# Patient Record
Sex: Male | Born: 1980
Health system: Southern US, Community
[De-identification: ages and names within clinical notes are randomized; demographics above are authoritative.]

## PROBLEM LIST (undated history)

## (undated) DIAGNOSIS — Z8669 Personal history of other diseases of the nervous system and sense organs: Secondary | ICD-10-CM

## (undated) DIAGNOSIS — R0989 Other specified symptoms and signs involving the circulatory and respiratory systems: Secondary | ICD-10-CM

## (undated) DIAGNOSIS — E782 Mixed hyperlipidemia: Secondary | ICD-10-CM

## (undated) DIAGNOSIS — K589 Irritable bowel syndrome without diarrhea: Secondary | ICD-10-CM

## (undated) HISTORY — PX: OTHER SURGICAL HISTORY: SHX169

## (undated) HISTORY — DX: Other specified symptoms and signs involving the circulatory and respiratory systems: R09.89

## (undated) HISTORY — DX: Mixed hyperlipidemia: E78.2

## (undated) HISTORY — DX: Irritable bowel syndrome, unspecified: K58.9

## (undated) HISTORY — DX: Personal history of other diseases of the nervous system and sense organs: Z86.69

## (undated) HISTORY — PX: WISDOM TOOTH EXTRACTION: SHX21

---

## 2011-01-15 ENCOUNTER — Telehealth: Payer: Self-pay | Admitting: Gastroenterology

## 2011-01-15 NOTE — Telephone Encounter (Signed)
Dwayne Pittman at Dr Kathryne Sharper office about 2:50pm. When I checked with Mike Gip, PA and looked for an appt it was 3:05pm before I called back and got the answering service-office closed at 3pm.  Information that crossed over in Epic is not current  Because we only took the md office contact.  Lurena Joiner reports pt with hx of IBS that Dr Oneta Rack has been managing. Today and yesterday, pt reports blood in his stool- 1st time ever. He was seen in their office today and they requested he be seen today. Pt c/o abdominal discomfort and llq pain. He denies diarrhea, only the bleeding.  Scheduled pt to see Dr Arlyce Dice on Monday, April 9, at 10:0am .  Called the answering service, [phone] and got Dr Elisabeth Most who will try to contact the pt about the appt. I will call their office on Monday and request office notes.

## 2011-01-19 ENCOUNTER — Ambulatory Visit (INDEPENDENT_AMBULATORY_CARE_PROVIDER_SITE_OTHER): Payer: Commercial Managed Care - PPO | Admitting: Gastroenterology

## 2011-01-19 ENCOUNTER — Encounter: Payer: Self-pay | Admitting: Gastroenterology

## 2011-01-19 VITALS — BP 114/70 | HR 80 | Ht 69.0 in | Wt 198.6 lb

## 2011-01-19 DIAGNOSIS — K589 Irritable bowel syndrome without diarrhea: Secondary | ICD-10-CM | POA: Insufficient documentation

## 2011-01-19 DIAGNOSIS — K625 Hemorrhage of anus and rectum: Secondary | ICD-10-CM

## 2011-01-19 MED ORDER — PEG-KCL-NACL-NASULF-NA ASC-C 100 G PO SOLR: 1.0000 | Freq: Once | ORAL | Status: AC

## 2011-01-19 MED ORDER — HYDROCORTISONE ACETATE 25 MG RE SUPP: 25.0000 mg | Freq: Two times a day (BID) | RECTAL | Status: AC

## 2011-01-19 NOTE — Telephone Encounter (Signed)
Received info from Dr Kathryne Sharper ofc and spoke with pt who will come in today.

## 2011-01-19 NOTE — Assessment & Plan Note (Signed)
Limited rectal bleeding is most likely due to hemorrhoids.  Recommendations #1 Anusol HC suppositories  #2 colonoscopy  Risks, alternatives, and complications of the procedure, including bleeding, perforation, and possible need for surgery, were explained to the patient.  Patient's questions were answered.

## 2011-01-19 NOTE — Patient Instructions (Addendum)
Hemorrhoids Hemorrhoids are dilated (enlarged) veins around the rectum. Sometimes clots will form in the veins. This makes them swollen and painful. These are called thrombosed hemorrhoids. Causes of hemorrhoids include:  Pregnancy: this increases the pressure in the hemorrhoidal veins.   Constipation.   Straining to have a bowel movement.  HOME CARE INSTRUCTIONS  Eat a well balanced diet and drink 6 to 8 glasses of water every day to avoid constipation. You may also use a bulk laxative.   Avoid straining to have bowel movements.   Keep anal area dry and clean.   Only take over-the-counter or prescription medicines for pain, discomfort, or fever as directed by your caregiver.  If thrombosed:  Take hot sitz baths for 20 to 30 minutes, 3 to 4 times per day.   If the hemorrhoids are very tender and swollen, place ice packs on area as tolerated. Using ice packs between sitz baths may be helpful. Fill a plastic bag with ice and use a towel between the bag of ice and your skin.   Special creams and suppositories (Anusol, Nupercainal, Wyanoids) may be used or applied as directed.   Do not use a donut shaped pillow or sit on the toilet for long periods. This increases blood pooling and pain.   Move your bowels when your body has the urge; this will require less straining and will decrease pain and pressure.   Only take over-the-counter or prescription medicines for pain, discomfort, or fever as directed by your caregiver.  SEEK MEDICAL CARE IF:  You have increasing pain and swelling that is not controlled with your prescription.   You have uncontrolled bleeding.   You have an inability or difficulty having a bowel movement.   You have pain or inflammation outside the area of the hemorrhoids.   You have chills and/or an oral temperature above 100 that lasts for 2 days or longer, or as your caregiver suggests.  MAKE SURE YOU:   Understand these instructions.   Will watch your  condition.   Will get help right away if you are not doing well or get worse.  Document Released: 09/25/2000 Document Re-Released: 09/10/2008 [facility] Patient Information 2011 [place], [place]. Your Colonoscopy is scheduled on 01/22/2011 at 9am We have sent your MoviPrep in to your pharmacy today   Colonoscopy A colonoscopy is an exam to evaluate your entire colon. In this exam, your colon is cleansed. A long fiberoptic tube is inserted through your rectum and into your colon. The fiberoptic scope (endoscope) is a long bundle of enclosed and very flexible fibers. These fibers transmit light to the area examined and send images from that area to your caregiver. Discomfort is usually minimal. You may be given a drug to help you sleep (sedative) during or prior to the procedure. This exam helps to detect lumps (tumors), polyps, inflammation, and areas of bleeding. Your caregiver may also take a small piece of tissue (biopsy) that will be examined under a microscope. BEFORE THE PROCEDURE  A clear liquid diet may be required for 2 days before the exam.   Liquid injections (enemas) or laxatives may be required.   A large amount of electrolyte solution may be given to you to drink over a short period of time. This solution is used to clean out your colon.   You should be present 1  prior to your procedure or as directed by your caregiver.   Check in at the admissions desk to fill out necessary forms if not  preregistered. There will be consent forms to sign prior to the procedure. If accompanied by friends or family, there is a waiting area for them while you are having your procedure.  LET YOUR CAREGIVER KNOW ABOUT:  Allergies to food or medicine.  Medicines taken, including vitamins, herbs, eyedrops, over-the-counter medicines, and creams.   Use of steroids (by mouth or creams).   Previous problems with anesthetics or numbing medicines.   History of bleeding problems or blood  clots.  Previous surgery.   Other health problems, including diabetes and kidney problems.   Possibility of pregnancy, if this applies.   AFTER THE PROCEDURE  If you received a sedative and/or pain medicine, you will need to arrange for someone to drive you home.   Occasionally, there is a little blood passed with the first bowel movement. DO NOT be concerned.  HOME CARE INSTRUCTIONS  It is not unusual to pass moderate amounts of gas and experience mild abdominal cramping following the procedure. This is due to air being used to inflate your colon during the exam. Walking or a warm pack on your belly (abdomen) may help.   You may resume all normal meals and activities after sedatives and medicines have worn off.   Only take over-the-counter or prescription medicines for pain, discomfort, or fever as directed by your caregiver. DO NOT use aspirin or blood thinners if a biopsy was taken. Consult your caregiver for medicine usage if biopsies were taken.  FINDING OUT THE RESULTS OF YOUR TEST Not all test results are available during your visit. If your test results are not back during the visit, make an appointment with your caregiver to find out the results. Do not assume everything is normal if you have not heard from your caregiver or the medical facility. It is important for you to follow up on all of your test results. SEEK IMMEDIATE MEDICAL CARE IF:  You pass large blood clots or fill a toilet with blood following the procedure. This may also occur 10 to 14 days following the procedure. This is more likely if a biopsy was taken.   You develop abdominal pain that keeps getting worse and cannot be relieved with medicine.  Document Released: 09/25/2000 Document Re-Released: 12/23/2009 [facility] Patient Information 2011 [place], [place].

## 2011-01-19 NOTE — Assessment & Plan Note (Signed)
The patient has symptoms compatible with mild IBS.  Recommendations #1 fiber supplementation. #2 continue hyomax

## 2011-01-19 NOTE — Progress Notes (Signed)
History of Present Illness:  [name] is a pleasant, 30 year old white male referred at the request of Dr. Oneta Rack for evaluation of rectal bleeding. For years he's had  Irritable bowel characterized by crampy lower bowel pain and loose stools, associated with stress and anxiety. He has a history of hemorrhoids characterized by rectal discomfort. In the last few weeks he has noted bright red blood mixed with his stools,  which are loose. He is stressed at the pregnancy of his girlfriend. He is currently without rectal pain.  Recent blood work in April, 2012 was pertinent for a normal CBC. Total bilirubin was 1.5 and indirect 1.3.    Review of Systems: Pertinent positive and negative review of systems were noted in the above HPI section. All other review of systems were otherwise negative.    Current Medications, Allergies, Past Medical History, Past Surgical History, Family History and Social History were reviewed in [facility] electronic medical record  Vital signs were reviewed in today's medical record. Physical Exam: General: Well developed , well nourished, no acute distress Head: Normocephalic and atraumatic Eyes:  sclerae anicteric, EOMI Ears: Normal auditory acuity Mouth: No deformity or lesions Lungs: Clear throughout to auscultation Heart: Regular rate and rhythm; no murmurs, rubs or bruits Abdomen: Soft, non tender and non distended. No masses, hepatosplenomegaly or hernias noted. Normal Bowel sounds Rectal:deferred Musculoskeletal: Symmetrical with no gross deformities  Pulses:  Normal pulses noted Extremities: No clubbing, cyanosis, edema or deformities noted Neurological: Alert oriented x 4, grossly nonfocal Psychological:  Alert and cooperative. Normal mood and affect

## 2011-01-19 NOTE — Assessment & Plan Note (Signed)
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

## 2011-01-20 ENCOUNTER — Encounter: Payer: Self-pay | Admitting: Gastroenterology

## 2011-01-21 ENCOUNTER — Encounter: Payer: Self-pay | Admitting: Gastroenterology

## 2011-01-22 ENCOUNTER — Encounter: Payer: Self-pay | Admitting: Gastroenterology

## 2011-01-22 ENCOUNTER — Ambulatory Visit (AMBULATORY_SURGERY_CENTER): Payer: Commercial Managed Care - PPO | Admitting: Gastroenterology

## 2011-01-22 VITALS — BP 123/72 | HR 73 | Temp 97.8°F | Resp 21 | Ht 69.0 in | Wt 195.0 lb

## 2011-01-22 DIAGNOSIS — K922 Gastrointestinal hemorrhage, unspecified: Secondary | ICD-10-CM

## 2011-01-22 DIAGNOSIS — K625 Hemorrhage of anus and rectum: Secondary | ICD-10-CM

## 2011-01-22 DIAGNOSIS — K648 Other hemorrhoids: Secondary | ICD-10-CM

## 2011-01-22 MED ORDER — SODIUM CHLORIDE 0.9 % IV SOLN: 500.0000 mL | INTRAVENOUS | Status: DC

## 2011-01-22 NOTE — Patient Instructions (Addendum)
Hemorrhoids Hemorrhoids are dilated (enlarged) veins around the rectum. Sometimes clots will form in the veins. This makes them swollen and painful. These are called thrombosed hemorrhoids. Causes of hemorrhoids include:  Pregnancy: this increases the pressure in the hemorrhoidal veins.   Constipation.   Straining to have a bowel movement.  HOME CARE INSTRUCTIONS  Eat a well balanced diet and drink 6 to 8 glasses of water every day to avoid constipation. You may also use a bulk laxative.   Avoid straining to have bowel movements.   Keep anal area dry and clean.   Only take over-the-counter or prescription medicines for pain, discomfort, or fever as directed by your caregiver.  If thrombosed:  Take hot sitz baths for 20 to 30 minutes, 3 to 4 times per day.   If the hemorrhoids are very tender and swollen, place ice packs on area as tolerated. Using ice packs between sitz baths may be helpful. Fill a plastic bag with ice and use a towel between the bag of ice and your skin.   Special creams and suppositories (Anusol, Nupercainal, Wyanoids) may be used or applied as directed.   Do not use a donut shaped pillow or sit on the toilet for long periods. This increases blood pooling and pain.   Move your bowels when your body has the urge; this will require less straining and will decrease pain and pressure.   Only take over-the-counter or prescription medicines for pain, discomfort, or fever as directed by your caregiver.  SEEK MEDICAL CARE IF:  You have increasing pain and swelling that is not controlled with your prescription.   You have uncontrolled bleeding.   You have an inability or difficulty having a bowel movement.   You have pain or inflammation outside the area of the hemorrhoids.   You have chills and/or an oral temperature above 100 that lasts for 2 days or longer, or as your caregiver suggests.  MAKE SURE YOU:   Understand these instructions.   Will watch your  condition.   Will get help right away if you are not doing well or get worse.  Document Released: 09/25/2000 Document Re-Released: 09/10/2008 [facility] Patient Information 2011 [place], [place]. Discharge instructions given with verbal understanding.

## 2011-01-23 ENCOUNTER — Telehealth: Payer: Self-pay | Admitting: *Deleted

## 2011-01-23 NOTE — Telephone Encounter (Signed)

## 2013-09-14 ENCOUNTER — Other Ambulatory Visit: Payer: Self-pay | Admitting: Internal Medicine

## 2014-03-10 DIAGNOSIS — Z8669 Personal history of other diseases of the nervous system and sense organs: Secondary | ICD-10-CM | POA: Insufficient documentation

## 2014-03-10 DIAGNOSIS — R0989 Other specified symptoms and signs involving the circulatory and respiratory systems: Secondary | ICD-10-CM | POA: Insufficient documentation

## 2014-03-10 DIAGNOSIS — E782 Mixed hyperlipidemia: Secondary | ICD-10-CM | POA: Insufficient documentation

## 2014-03-15 ENCOUNTER — Ambulatory Visit (INDEPENDENT_AMBULATORY_CARE_PROVIDER_SITE_OTHER): Payer: Commercial Managed Care - PPO | Admitting: Internal Medicine

## 2014-03-15 ENCOUNTER — Encounter: Payer: Self-pay | Admitting: Internal Medicine

## 2014-03-15 VITALS — BP 106/70 | HR 76 | Temp 98.1°F | Resp 16 | Ht 68.75 in | Wt 191.8 lb

## 2014-03-15 DIAGNOSIS — E559 Vitamin D deficiency, unspecified: Secondary | ICD-10-CM | POA: Insufficient documentation

## 2014-03-15 DIAGNOSIS — Z113 Encounter for screening for infections with a predominantly sexual mode of transmission: Secondary | ICD-10-CM

## 2014-03-15 DIAGNOSIS — Z1212 Encounter for screening for malignant neoplasm of rectum: Secondary | ICD-10-CM

## 2014-03-15 DIAGNOSIS — R740 Nonspecific elevation of levels of transaminase and lactic acid dehydrogenase [LDH]: Secondary | ICD-10-CM

## 2014-03-15 DIAGNOSIS — R0989 Other specified symptoms and signs involving the circulatory and respiratory systems: Secondary | ICD-10-CM

## 2014-03-15 DIAGNOSIS — Z0000 Encounter for general adult medical examination without abnormal findings: Secondary | ICD-10-CM

## 2014-03-15 DIAGNOSIS — R7402 Elevation of levels of lactic acid dehydrogenase (LDH): Secondary | ICD-10-CM

## 2014-03-15 DIAGNOSIS — R7401 Elevation of levels of liver transaminase levels: Secondary | ICD-10-CM

## 2014-03-15 DIAGNOSIS — Z111 Encounter for screening for respiratory tuberculosis: Secondary | ICD-10-CM

## 2014-03-15 LAB — CBC WITH DIFFERENTIAL/PLATELET
BASOS PCT: 0 % (ref 0–1)
Basophils Absolute: 0 10*3/uL (ref 0.0–0.1)
EOS PCT: 1 % (ref 0–5)
Eosinophils Absolute: 0.1 10*3/uL (ref 0.0–0.7)
HEMATOCRIT: 43.6 % (ref 39.0–52.0)
Hemoglobin: 15.2 g/dL (ref 13.0–17.0)
Lymphocytes Relative: 28 % (ref 12–46)
Lymphs Abs: 1.6 10*3/uL (ref 0.7–4.0)
MCH: 29.5 pg (ref 26.0–34.0)
MCHC: 34.9 g/dL (ref 30.0–36.0)
MCV: 84.5 fL (ref 78.0–100.0)
MONO ABS: 0.3 10*3/uL (ref 0.1–1.0)
Monocytes Relative: 6 % (ref 3–12)
Neutro Abs: 3.6 10*3/uL (ref 1.7–7.7)
Neutrophils Relative %: 65 % (ref 43–77)
Platelets: 190 10*3/uL (ref 150–400)
RBC: 5.16 MIL/uL (ref 4.22–5.81)
RDW: 13.4 % (ref 11.5–15.5)
WBC: 5.6 10*3/uL (ref 4.0–10.5)

## 2014-03-15 LAB — HEMOGLOBIN A1C
Hgb A1c MFr Bld: 5.4 % (ref <5.7)
MEAN PLASMA GLUCOSE: 108 mg/dL (ref <117)

## 2014-03-15 NOTE — Patient Instructions (Addendum)
Irritable Bowel Syndrome Irritable Bowel Syndrome (IBS) is caused by a disturbance of normal bowel function. Other terms used are spastic colon, mucous colitis, and irritable colon. It does not require surgery, nor does it lead to cancer. There is no cure for IBS. But with proper diet, stress reduction, and medication, you will find that your problems (symptoms) will gradually disappear or improve. IBS is a common digestive disorder. It usually appears in late adolescence or early adulthood. Women develop it twice as often as men. CAUSES  After food has been digested and absorbed in the small intestine, waste material is moved into the colon (large intestine). In the colon, water and salts are absorbed from the undigested products coming from the small intestine. The remaining residue, or fecal material, is held for elimination. Under normal circumstances, gentle, rhythmic contractions on the bowel walls push the fecal material along the colon towards the rectum. In IBS, however, these contractions are irregular and poorly coordinated. The fecal material is either retained too long, resulting in constipation, or expelled too soon, producing diarrhea. SYMPTOMS  The most common symptom of IBS is pain. It is typically in the lower left side of the belly (abdomen). But it may occur anywhere in the abdomen. It can be felt as heartburn, backache, or even as a dull pain in the arms or shoulders. The pain comes from excessive bowel-muscle spasms and from the buildup of gas and fecal material in the colon. This pain:  Can range from sharp belly (abdominal) cramps to a dull, continuous ache.  Usually worsens soon after eating.  Is typically relieved by having a bowel movement or passing gas. Abdominal pain is usually accompanied by constipation. But it may also produce diarrhea. The diarrhea typically occurs right after a meal or upon arising in the morning. The stools are typically soft and watery. They are often  flecked with secretions (mucus). Other symptoms of IBS include:  Bloating.  Loss of appetite.  Heartburn.  Feeling sick to your stomach (nausea).  Belching  Vomiting  Gas. IBS may also cause a number of symptoms that are unrelated to the digestive system:  Fatigue.  Headaches.  Anxiety  Shortness of breath  Difficulty in concentrating.  Dizziness. These symptoms tend to come and go. DIAGNOSIS  The symptoms of IBS closely mimic the symptoms of other, more serious digestive disorders. So your caregiver may wish to perform a variety of additional tests to exclude these disorders. He/she wants to be certain of learning what is wrong (diagnosis). The nature and purpose of each test will be explained to you. TREATMENT A number of medications are available to help correct bowel function and/or relieve bowel spasms and abdominal pain. Among the drugs available are:  Mild, non-irritating laxatives for severe constipation and to help restore normal bowel habits.  Specific anti-diarrheal medications to treat severe or prolonged diarrhea.  Anti-spasmodic agents to relieve intestinal cramps.  Your caregiver may also decide to treat you with a mild tranquilizer or sedative during unusually stressful periods in your life. The important thing to remember is that if any drug is prescribed for you, make sure that you take it exactly as directed. Make sure that your caregiver knows how well it worked for you. HOME CARE INSTRUCTIONS   Avoid foods that are high in fat or oils. Some examples JJO:ACZYS cream, butter, frankfurters, sausage, and other fatty meats.  Avoid foods that have a laxative effect, such as fruit, fruit juice, and dairy products.  Cut out  carbonated drinks, chewing gum, and "gassy" foods, such as beans and cabbage. This may help relieve bloating and belching.  Bran taken with plenty of liquids may help relieve constipation.  Keep track of what foods seem to trigger  your symptoms.  Avoid emotionally charged situations or circumstances that produce anxiety.  Start or continue exercising.  Get plenty of rest and sleep. MAKE SURE YOU:   Understand these instructions.  Will watch your condition.  Will get help right away if you are not doing well or get worse. Document Released: 09/28/2005 Document Revised: 12/21/2011 Document Reviewed: 05/18/2008 [facility] Patient Information 2014 [place], [place].   Diet and Irritable Bowel Syndrome  No cure has been found for irritable bowel syndrome (IBS). Many options are available to treat the symptoms. Your caregiver will give you the best treatments available for your symptoms. He or she will also encourage you to manage stress and to make changes to your diet. You need to work with your caregiver and Registered Dietician to find the best combination of medicine, diet, counseling, and support to control your symptoms. The following are some diet suggestions. FOODS THAT MAKE IBS WORSE  Fatty foods, such as [place] fries.  Milk products, such as cheese or ice cream.  Chocolate.  Alcohol.  Caffeine (found in coffee and some sodas).  Carbonated drinks, such as soda. If certain foods cause symptoms, you should eat less of them or stop eating them. FOOD JOURNAL   Keep a journal of the foods that seem to cause distress. Write down:  What you are eating during the day and when.  What problems you are having after eating.  When the symptoms occur in relation to your meals.  What foods always make you feel badly.  Take your notes with you to your caregiver to see if you should stop eating certain foods. FOODS THAT MAKE IBS BETTER Fiber reduces IBS symptoms, especially constipation, because it makes stools soft, bulky, and easier to pass. Fiber is found in bran, bread, cereal, beans, fruit, and vegetables. Examples of foods with fiber  include:  Apples.  Peaches.  Pears.  Berries.  Figs.  Broccoli, raw.  Cabbage.  Carrots.  Raw peas.  Kidney beans.  Lima beans.  Whole-grain bread.  Whole-grain cereal. Add foods with fiber to your diet a little at a time. This will let your body get used to them. Too much fiber at once might cause gas and swelling of your abdomen. This can trigger symptoms in a person with IBS. Caregivers usually recommend a diet with enough fiber to produce soft, painless bowel movements. High fiber diets may cause gas and bloating. However, these symptoms often go away within a few weeks, as your body adjusts. In many cases, dietary fiber may lessen IBS symptoms, particularly constipation. However, it may not help pain or diarrhea. High fiber diets keep the colon mildly enlarged (distended) with the added fiber. This may help prevent spasms in the colon. Some forms of fiber also keep water in the stool, thereby preventing hard stools that are difficult to pass.  Besides telling you to eat more foods with fiber, your caregiver may also tell you to get more fiber by taking a fiber pill or drinking water mixed with a special high fiber powder. An example of this is a natural fiber laxative containing psyllium seed.  TIPS  Large meals can cause cramping and diarrhea in people with IBS. If this happens to you, try eating 4 or 5 small meals  a day, or try eating less at each of your usual 3 meals. It may also help if your meals are low in fat and high in carbohydrates. Examples of carbohydrates are pasta, rice, whole-grain breads and cereals, fruits, and vegetables.  If dairy products cause your symptoms to flare up, you can try eating less of those foods. You might be able to handle yogurt better than other dairy products, because it contains bacteria that helps with digestion. Dairy products are an important source of calcium and other nutrients. If you need to avoid dairy products, be sure to talk  with a Registered Dietitian about getting these nutrients through other food sources.  Drink enough water and fluids to keep your urine clear or pale yellow. This is important, especially if you have diarrhea. FOR MORE INFORMATION  International Foundation for Functional Gastrointestinal Disorders: www.iffgd.org  National Digestive Diseases Information Clearinghouse: digestive.StageSync.si Document Released: 12/19/2003 Document Revised: 12/21/2011 Document Reviewed: 09/05/2007 [facility] Patient Information 2014 [place], [place].   Hypertension As your heart beats, it forces blood through your arteries. This force is your blood pressure. If the pressure is too high, it is called hypertension (HTN) or high blood pressure. HTN is dangerous because you may have it and not know it. High blood pressure may mean that your heart has to work harder to pump blood. Your arteries may be narrow or stiff. The extra work puts you at risk for heart disease, stroke, and other problems.  Blood pressure consists of two numbers, a higher number over a lower, 110/72, for example. It is stated as "110 over 72." The ideal is below 120 for the top number (systolic) and under 80 for the bottom (diastolic). Write down your blood pressure today. You should pay close attention to your blood pressure if you have certain conditions such as:  Heart failure.  Prior heart attack.  Diabetes  Chronic kidney disease.  Prior stroke.  Multiple risk factors for heart disease. To see if you have HTN, your blood pressure should be measured while you are seated with your arm held at the level of the heart. It should be measured at least twice. A one-time elevated blood pressure reading (especially in the Emergency Department) does not mean that you need treatment. There may be conditions in which the blood pressure is different between your right and left arms. It is important to see your caregiver soon for a recheck. Most people  have essential hypertension which means that there is not a specific cause. This type of high blood pressure may be lowered by changing lifestyle factors such as:  Stress.  Smoking.  Lack of exercise.  Excessive weight.  Drug/tobacco/alcohol use.  Eating less salt. Most people do not have symptoms from high blood pressure until it has caused damage to the body. Effective treatment can often prevent, delay or reduce that damage. TREATMENT  When a cause has been identified, treatment for high blood pressure is directed at the cause. There are a large number of medications to treat HTN. These fall into several categories, and your caregiver will help you select the medicines that are best for you. Medications may have side effects. You should review side effects with your caregiver. If your blood pressure stays high after you have made lifestyle changes or started on medicines,   Your medication(s) may need to be changed.  Other problems may need to be addressed.  Be certain you understand your prescriptions, and know how and when to take your medicine.  Be sure to follow up with your caregiver within the time frame advised (usually within two weeks) to have your blood pressure rechecked and to review your medications.  If you are taking more than one medicine to lower your blood pressure, make sure you know how and at what times they should be taken. Taking two medicines at the same time can result in blood pressure that is too low. SEEK IMMEDIATE MEDICAL CARE IF:  You develop a severe headache, blurred or changing vision, or confusion.  You have unusual weakness or numbness, or a faint feeling.  You have severe chest or abdominal pain, vomiting, or breathing problems. MAKE SURE YOU:   Understand these instructions.  Will watch your condition.  Will get help right away if you are not doing well or get worse.   Diabetes and Exercise Exercising regularly is important. It is  not just about losing weight. It has many health benefits, such as:  Improving your overall fitness, flexibility, and endurance.  Increasing your bone density.  Helping with weight control.  Decreasing your body fat.  Increasing your muscle strength.  Reducing stress and tension.  Improving your overall health. People with diabetes who exercise gain additional benefits because exercise:  Reduces appetite.  Improves the body's use of blood sugar (glucose).  Helps lower or control blood glucose.  Decreases blood pressure.  Helps control blood lipids (such as cholesterol and triglycerides).  Improves the body's use of the hormone insulin by:  Increasing the body's insulin sensitivity.  Reducing the body's insulin needs.  Decreases the risk for heart disease because exercising:  Lowers cholesterol and triglycerides levels.  Increases the levels of good cholesterol (such as high-density lipoproteins [HDL]) in the body.  Lowers blood glucose levels. YOUR ACTIVITY PLAN  Choose an activity that you enjoy and set realistic goals. Your health care provider or diabetes educator can help you make an activity plan that works for you. You can break activities into 2 or 3 sessions throughout the day. Doing so is as good as one long session. Exercise ideas include:  Taking the dog for a walk.  Taking the stairs instead of the elevator.  Dancing to your favorite song.  Doing your favorite exercise with a friend. RECOMMENDATIONS FOR EXERCISING WITH TYPE 1 OR TYPE 2 DIABETES   Check your blood glucose before exercising. If blood glucose levels are greater than 240 mg/dL, check for urine ketones. Do not exercise if ketones are present.  Avoid injecting insulin into areas of the body that are going to be exercised. For example, avoid injecting insulin into:  The arms when playing tennis.  The legs when jogging.  Keep a record of:  Food intake before and after you  exercise.  Expected peak times of insulin action.  Blood glucose levels before and after you exercise.  The type and amount of exercise you have done.  Review your records with your health care provider. Your health care provider will help you to develop guidelines for adjusting food intake and insulin amounts before and after exercising.  If you take insulin or oral hypoglycemic agents, watch for signs and symptoms of hypoglycemia. They include:  Dizziness.  Shaking.  Sweating.  Chills.  Confusion.  Drink plenty of water while you exercise to prevent dehydration or heat stroke. Body water is lost during exercise and must be replaced.  Talk to your health care provider before starting an exercise program to make sure it is safe for you. Remember, almost  any type of activity is better than none.    Cholesterol Cholesterol is a white, waxy, fat-like protein needed by your body in small amounts. The liver makes all the cholesterol you need. It is carried from the liver by the blood through the blood vessels. Deposits (plaque) may build up on blood vessel walls. This makes the arteries narrower and stiffer. Plaque increases the risk for heart attack and stroke. You cannot feel your cholesterol level even if it is very high. The only way to know is by a blood test to check your lipid (fats) levels. Once you know your cholesterol levels, you should keep a record of the test results. Work with your caregiver to to keep your levels in the desired range. WHAT THE RESULTS MEAN:  Total cholesterol is a rough measure of all the cholesterol in your blood.  LDL is the so-called bad cholesterol. This is the type that deposits cholesterol in the walls of the arteries. You want this level to be low.  HDL is the good cholesterol because it cleans the arteries and carries the LDL away. You want this level to be high.  Triglycerides are fat that the body can either burn for energy or store. High  levels are closely linked to heart disease. DESIRED LEVELS:  Total cholesterol below 200.  LDL below 100 for people at risk, below 70 for very high risk.  HDL above 50 is good, above 60 is best.  Triglycerides below 150. HOW TO LOWER YOUR CHOLESTEROL:  Diet.  Choose fish or white meat chicken and [place], roasted or baked. Limit fatty cuts of red meat, fried foods, and processed meats, such as sausage and lunch meat.  Eat lots of fresh fruits and vegetables. Choose whole grains, beans, pasta, potatoes and cereals.  Use only small amounts of olive, corn or canola oils. Avoid butter, mayonnaise, shortening or palm kernel oils. Avoid foods with trans-fats.  Use skim/nonfat milk and low-fat/nonfat yogurt and cheeses. Avoid whole milk, cream, ice cream, egg yolks and cheeses. Healthy desserts include angel food cake, ginger snaps, animal crackers, hard candy, popsicles, and low-fat/nonfat frozen yogurt. Avoid pastries, cakes, pies and cookies.  Exercise.  A regular program helps decrease LDL and raises HDL.  Helps with weight control.  Do things that increase your activity level like gardening, walking, or taking the stairs.  Medication.  May be prescribed by your caregiver to help lowering cholesterol and the risk for heart disease.  You may need medicine even if your levels are normal if you have several risk factors. HOME CARE INSTRUCTIONS   Follow your diet and exercise programs as suggested by your caregiver.  Take medications as directed.  Have blood work done when your caregiver feels it is necessary. MAKE SURE YOU:   Understand these instructions.  Will watch your condition.  Will get help right away if you are not doing well or get worse.      Vitamin D Deficiency Vitamin D is an important vitamin that your body needs. Having too little of it in your body is called a deficiency. A very bad deficiency can make your bones soft and can cause a condition called  rickets.  Vitamin D is important to your body for different reasons, such as:   It helps your body absorb 2 minerals called calcium and phosphorus.  It helps make your bones healthy.  It may prevent some diseases, such as diabetes and multiple sclerosis.  It helps your muscles and heart. You  can get vitamin D in several ways. It is a natural part of some foods. The vitamin is also added to some dairy products and cereals. Some people take vitamin D supplements. Also, your body makes vitamin D when you are in the sun. It changes the sun's rays into a form of the vitamin that your body can use. CAUSES   Not eating enough foods that contain vitamin D.  Not getting enough sunlight.  Having certain digestive system diseases that make it hard to absorb vitamin D. These diseases include Crohn's disease, chronic pancreatitis, and cystic fibrosis.  Having a surgery in which part of the stomach or small intestine is removed.  Being obese. Fat cells pull vitamin D out of your blood. That means that obese people may not have enough vitamin D left in their blood and in other body tissues.  Having chronic kidney or liver disease. RISK FACTORS Risk factors are things that make you more likely to develop a vitamin D deficiency. They include:  Being older.  Not being able to get outside very much.  Living in a nursing home.  Having had broken bones.  Having weak or thin bones (osteoporosis).  Having a disease or condition that changes how your body absorbs vitamin D.  Having dark skin.  Some medicines such as seizure medicines or steroids.  Being overweight or obese. SYMPTOMS Mild cases of vitamin D deficiency may not have any symptoms. If you have a very bad case, symptoms may include:  Bone pain.  Muscle pain.  Falling often.  Broken bones caused by a minor injury, due to osteoporosis. DIAGNOSIS A blood test is the best way to tell if you have a vitamin D  deficiency. TREATMENT Vitamin D deficiency can be treated in different ways. Treatment for vitamin D deficiency depends on what is causing it. Options include:  Taking vitamin D supplements.  Taking a calcium supplement. Your caregiver will suggest what dose is best for you. HOME CARE INSTRUCTIONS  Take any supplements that your caregiver prescribes. Follow the directions carefully. Take only the suggested amount.  Have your blood tested 2 months after you start taking supplements.  Eat foods that contain vitamin D. Healthy choices include:  Fortified dairy products, cereals, or juices. Fortified means vitamin D has been added to the food. Check the label on the package to be sure.  Fatty fish like salmon or trout.  Eggs.  Oysters.  Do not use a tanning bed.  Keep your weight at a healthy level. Lose weight if you need to.  Keep all follow-up appointments. Your caregiver will need to perform blood tests to make sure your vitamin D deficiency is going away. SEEK MEDICAL CARE IF:  You have any questions about your treatment.  You continue to have symptoms of vitamin D deficiency.  You have nausea or vomiting.  You are constipated.  You feel confused.  You have severe abdominal or back pain. MAKE SURE YOU:  Understand these instructions.  Will watch your condition.  Will get help right away if you are not doing well or get worse.

## 2014-03-15 NOTE — Progress Notes (Signed)
Patient ID: Dwayne Pittman, male   DOB: [birth year], 33 y.o.   MRN: [id number]   Annual Screening Comprehensive Examination  This very nice 32 y.o.MWM presents for complete physical.  Patient has been followed for Labile HTN and Vitamin D Deficiency.   Patient has Hx/o sl elevated BP's in 2011 & 2012 and has been monitored expectantly. Patient's BP has been controlled and today's BP: 106/70 mmHg. Patient denies any cardiac symptoms as chest pain, palpitations, shortness of breath, dizziness or ankle swelling.   Patient's hyperlipidemia is controlled with diet. Patient denies myalgias or other medication SE's. Last cholesterol last visit was  164, triglycerides 114, HDL 36 and LDL slightly elevated at 105 in May 2014.     Patient is screened for  prediabetes/insulin resistance  and A1c was 5.5% in 2013 and 5.2% in 2014 - both normal. Patient denies reactive hypoglycemic symptoms, visual blurring, diabetic polys or paresthesias.    Finally, patient has history of Vitamin D Deficiency and last vitamin D was 75 in May 2014.  Medication Sig  . VIT D 5000  5000 UNITS cap Take 4,000 Units by mouth daily.    . hyoscyamine (LEVBID) 0.375 MG 12 hr tab  1 TAB BY MOUTH TWICE A DAY FOR SPASTIC COLON  . MAGNESIUM PO Take  daily.  Marland Kitchen MILK THISTLE PO Take  daily.   No Known Allergies  Past Medical History  Diagnosis Date  . Irritable bowel syndrome   . Allergy   . Hyperlipidemia   . Mixed hyperlipidemia   . Labile hypertension   . Hx of migraines     Past Surgical History  Procedure Laterality Date  . Duodenal surgery      as an infant  . Great toe ingrown toenail removal    . Wisdom tooth extraction      Family History  Problem Relation Age of Onset  . Colon cancer Neg Hx   . Irritable bowel syndrome Mother   . Hypertension Mother   . Hyperlipidemia Mother   . Irritable bowel syndrome Cousin   . Diverticulitis Cousin     History   Social History  . Marital Status: Single   Spouse Name: N/A    Number of Children: N/A  . Years of Education: N/A   Occupational History  . electrician    Social History Main Topics  . Smoking status: Former Smoker    Types: Cigarettes    Quit date: 10/12/2000  . Smokeless tobacco: Not on file  . Alcohol Use: 1.2 oz/week    2 Cans of beer per week     Comment: 2 beers per week  . Drug Use: No  . Sexual Activity: Not on file   Other Topics Concern  . Not on file   Social History Narrative  . No narrative on file    ROS Constitutional: Denies fever, chills, weight loss/gain, headaches, insomnia, fatigue, night sweats or change in appetite. Eyes: Denies redness, blurred vision, diplopia, discharge, itchy or watery eyes.  ENT: Denies discharge, congestion, post nasal drip, epistaxis, sore throat, earache, hearing loss, dental pain, Tinnitus, Vertigo, Sinus pain or snoring.  Cardio: Denies chest pain, palpitations, irregular heartbeat, syncope, dyspnea, diaphoresis, orthopnea, PND, claudication or edema Respiratory: denies cough, dyspnea, DOE, pleurisy, hoarseness, laryngitis or wheezing.  Gastrointestinal: Denies dysphagia, heartburn, reflux, water brash, pain, cramps, nausea, vomiting, bloating, diarrhea, constipation, hematemesis, melena, hematochezia, jaundice or hemorrhoids Genitourinary: Denies dysuria, frequency, urgency, nocturia, hesitancy, discharge, hematuria or flank pain Musculoskeletal: Denies arthralgia,  myalgia, stiffness, Jt. Swelling, pain, limp or strain/sprain. Skin: Denies puritis, rash, hives, warts, acne, eczema or change in skin lesion Neuro: No weakness, tremor, incoordination, spasms, paresthesia or pain Psychiatric: Denies confusion, memory loss or sensory loss Endocrine: Denies change in weight, skin, hair change, nocturia, and paresthesia, diabetic polys, visual blurring or hyper / hypo glycemic episodes.  Heme/Lymph: No excessive bleeding, bruising or enlarged lymph nodes.  Physical Exam  BP  106/70  P 76  T 98.1 F   R 16  Ht 5' 8.75"   Wt 191 lb 12.8 oz   BMI 28.54 kg/m2  General Appearance: Well nourished, in no apparent distress. Eyes: PERRLA, EOMs, conjunctiva no swelling or erythema, normal fundi and vessels. Sinuses: No frontal/maxillary tenderness ENT/Mouth: EACs patent / TMs  nl. Nares clear without erythema, swelling, mucoid exudates. Oral hygiene is good. No erythema, swelling, or exudate. Tongue normal, non-obstructing. Tonsils not swollen or erythematous. Hearing normal.  Neck: Supple, thyroid normal. No bruits, nodes or JVD. Respiratory: Respiratory effort normal.  BS equal and clear bilateral without rales, rhonci, wheezing or stridor. Cardio: Heart sounds are normal with regular rate and rhythm and no murmurs, rubs or gallops. Peripheral pulses are normal and equal bilaterally without edema. No aortic or femoral bruits. Chest: symmetric with normal excursions and percussion.  Abdomen: Flat, soft, with bowl sounds. Nontender, no guarding, rebound, hernias, masses, or organomegaly.  Lymphatics: Non tender without lymphadenopathy.  Genitourinary: No hernias.Testes nl. DRE - deferred for age. Musculoskeletal: Full ROM all peripheral extremities, joint stability, 5/5 strength, and normal gait. Skin: Warm and dry without rashes, lesions, cyanosis, clubbing or  ecchymosis.  Neuro: Cranial nerves intact, reflexes equal bilaterally. Normal muscle tone, no cerebellar symptoms. Sensation intact.  Pysch: Awake and oriented X 3, normal affect, insight and judgment appropriate.   Assessment and Plan  1. Annual Screening Examination 2. Hypertension  3. Hyperlipidemia 4. Vitamin D Deficiency 5. IBS Continue prudent diet as discussed, weight control, BP monitoring, regular exercise, and medications as discussed.  Discussed med effects and SE's. Routine screening labs and tests as requested with regular follow-up as recommended.

## 2014-03-16 LAB — LIPID PANEL
Cholesterol: 191 mg/dL (ref 0–200)
HDL: 42 mg/dL (ref >39)
LDL CALC: 110 mg/dL — AB (ref 0–99)
TRIGLYCERIDES: 193 mg/dL — AB (ref <150)
Total CHOL/HDL Ratio: 4.5 Ratio
VLDL: 39 mg/dL (ref 0–40)

## 2014-03-16 LAB — BASIC METABOLIC PANEL WITH GFR
BUN: 17 mg/dL (ref 6–23)
CHLORIDE: 100 meq/L (ref 96–112)
CO2: 24 mEq/L (ref 19–32)
CREATININE: 0.9 mg/dL (ref 0.50–1.35)
Calcium: 10 mg/dL (ref 8.4–10.5)
GFR, Est African American: >89 mL/min
GFR, Est Non African American: >89 mL/min
Glucose, Bld: 107 mg/dL — ABNORMAL HIGH (ref 70–99)
Potassium: 4.1 mEq/L (ref 3.5–5.3)
Sodium: 137 mEq/L (ref 135–145)

## 2014-03-16 LAB — MICROALBUMIN / CREATININE URINE RATIO
Creatinine, Urine: 107.7 mg/dL
Microalb Creat Ratio: 8.3 mg/g (ref 0.0–30.0)
Microalb, Ur: 0.89 mg/dL (ref 0.00–1.89)

## 2014-03-16 LAB — HEPATITIS B CORE ANTIBODY, TOTAL: HEP B C TOTAL AB: NONREACTIVE

## 2014-03-16 LAB — URINALYSIS, MICROSCOPIC ONLY
Bacteria, UA: NONE SEEN
CASTS: NONE SEEN
CRYSTALS: NONE SEEN
SQUAMOUS EPITHELIAL / LPF: NONE SEEN

## 2014-03-16 LAB — HIV ANTIBODY (ROUTINE TESTING W REFLEX): HIV 1&2 Ab, 4th Generation: NONREACTIVE

## 2014-03-16 LAB — HEPATIC FUNCTION PANEL
ALBUMIN: 4.5 g/dL (ref 3.5–5.2)
ALK PHOS: 49 U/L (ref 39–117)
ALT: 14 U/L (ref 0–53)
AST: 19 U/L (ref 0–37)
Bilirubin, Direct: 0.2 mg/dL (ref 0.0–0.3)
Indirect Bilirubin: 1.3 mg/dL — ABNORMAL HIGH (ref 0.2–1.2)
TOTAL PROTEIN: 7.2 g/dL (ref 6.0–8.3)
Total Bilirubin: 1.5 mg/dL — ABNORMAL HIGH (ref 0.2–1.2)

## 2014-03-16 LAB — MAGNESIUM: MAGNESIUM: 1.8 mg/dL (ref 1.5–2.5)

## 2014-03-16 LAB — RPR

## 2014-03-16 LAB — VITAMIN D 25 HYDROXY (VIT D DEFICIENCY, FRACTURES): VIT D 25 HYDROXY: 73 ng/mL (ref 30–89)

## 2014-03-16 LAB — HEPATITIS C ANTIBODY: HCV AB: NEGATIVE

## 2014-03-16 LAB — HEPATITIS A ANTIBODY, TOTAL: HEP A TOTAL AB: NONREACTIVE

## 2014-03-16 LAB — TESTOSTERONE: TESTOSTERONE: 401 ng/dL (ref 300–890)

## 2014-03-16 LAB — INSULIN, FASTING: INSULIN FASTING, SERUM: 196 u[IU]/mL — AB (ref 3–28)

## 2014-03-16 LAB — VITAMIN B12: Vitamin B-12: 552 pg/mL (ref 211–911)

## 2014-03-16 LAB — HEPATITIS B SURFACE ANTIBODY,QUALITATIVE: HEP B S AB: NEGATIVE

## 2014-03-16 LAB — TSH: TSH: 1.417 u[IU]/mL (ref 0.350–4.500)

## 2014-03-19 LAB — HEPATITIS B E ANTIBODY: HEPATITIS BE ANTIBODY: NONREACTIVE

## 2014-03-27 LAB — TB SKIN TEST
Induration: 0 mm
TB Skin Test: NEGATIVE

## 2015-03-07 ENCOUNTER — Other Ambulatory Visit: Payer: Self-pay | Admitting: Internal Medicine

## 2015-03-18 ENCOUNTER — Encounter: Payer: Self-pay | Admitting: Internal Medicine

## 2015-03-22 ENCOUNTER — Ambulatory Visit (INDEPENDENT_AMBULATORY_CARE_PROVIDER_SITE_OTHER): Payer: Commercial Managed Care - PPO | Admitting: Internal Medicine

## 2015-03-22 ENCOUNTER — Encounter: Payer: Self-pay | Admitting: Internal Medicine

## 2015-03-22 VITALS — BP 124/80 | HR 62 | Temp 98.2°F | Resp 18 | Ht 68.0 in | Wt 188.0 lb

## 2015-03-22 DIAGNOSIS — E559 Vitamin D deficiency, unspecified: Secondary | ICD-10-CM

## 2015-03-22 DIAGNOSIS — I10 Essential (primary) hypertension: Secondary | ICD-10-CM

## 2015-03-22 DIAGNOSIS — Z0001 Encounter for general adult medical examination with abnormal findings: Secondary | ICD-10-CM | POA: Insufficient documentation

## 2015-03-22 DIAGNOSIS — K589 Irritable bowel syndrome without diarrhea: Secondary | ICD-10-CM

## 2015-03-22 DIAGNOSIS — Z8669 Personal history of other diseases of the nervous system and sense organs: Secondary | ICD-10-CM

## 2015-03-22 DIAGNOSIS — R7309 Other abnormal glucose: Secondary | ICD-10-CM

## 2015-03-22 DIAGNOSIS — R0989 Other specified symptoms and signs involving the circulatory and respiratory systems: Secondary | ICD-10-CM

## 2015-03-22 DIAGNOSIS — Z1212 Encounter for screening for malignant neoplasm of rectum: Secondary | ICD-10-CM

## 2015-03-22 DIAGNOSIS — E782 Mixed hyperlipidemia: Secondary | ICD-10-CM

## 2015-03-22 DIAGNOSIS — R5383 Other fatigue: Secondary | ICD-10-CM

## 2015-03-22 DIAGNOSIS — Z79899 Other long term (current) drug therapy: Secondary | ICD-10-CM

## 2015-03-22 LAB — BASIC METABOLIC PANEL WITH GFR
BUN: 12 mg/dL (ref 6–23)
CHLORIDE: 106 meq/L (ref 96–112)
CO2: 26 mEq/L (ref 19–32)
Calcium: 9.8 mg/dL (ref 8.4–10.5)
Creat: 0.96 mg/dL (ref 0.50–1.35)
GFR, Est African American: >89 mL/min
GLUCOSE: 96 mg/dL (ref 70–99)
Potassium: 4.4 mEq/L (ref 3.5–5.3)
SODIUM: 139 meq/L (ref 135–145)

## 2015-03-22 LAB — CBC WITH DIFFERENTIAL/PLATELET
Basophils Absolute: 0 10*3/uL (ref 0.0–0.1)
Basophils Relative: 0 % (ref 0–1)
Eosinophils Absolute: 0.1 10*3/uL (ref 0.0–0.7)
Eosinophils Relative: 2 % (ref 0–5)
HCT: 43.7 % (ref 39.0–52.0)
Hemoglobin: 15.5 g/dL (ref 13.0–17.0)
Lymphocytes Relative: 34 % (ref 12–46)
Lymphs Abs: 1.9 10*3/uL (ref 0.7–4.0)
MCH: 29.8 pg (ref 26.0–34.0)
MCHC: 35.5 g/dL (ref 30.0–36.0)
MCV: 83.9 fL (ref 78.0–100.0)
Monocytes Absolute: 0.5 10*3/uL (ref 0.1–1.0)
Monocytes Relative: 8 % (ref 3–12)
NEUTROS ABS: 3.2 10*3/uL (ref 1.7–7.7)
Neutrophils Relative %: 56 % (ref 43–77)
Platelets: 169 10*3/uL (ref 150–400)
RBC: 5.21 MIL/uL (ref 4.22–5.81)
RDW: 13.3 % (ref 11.5–15.5)
WBC: 5.7 10*3/uL (ref 4.0–10.5)

## 2015-03-22 LAB — VITAMIN B12: VITAMIN B 12: 563 pg/mL (ref 211–911)

## 2015-03-22 LAB — HEPATIC FUNCTION PANEL
ALBUMIN: 4.3 g/dL (ref 3.5–5.2)
ALT: 15 U/L (ref 0–53)
AST: 17 U/L (ref 0–37)
Alkaline Phosphatase: 59 U/L (ref 39–117)
BILIRUBIN DIRECT: 0.2 mg/dL (ref 0.0–0.3)
BILIRUBIN INDIRECT: 0.8 mg/dL (ref 0.2–1.2)
TOTAL PROTEIN: 6.7 g/dL (ref 6.0–8.3)
Total Bilirubin: 1 mg/dL (ref 0.2–1.2)

## 2015-03-22 LAB — LIPID PANEL
CHOL/HDL RATIO: 5.2 ratio
Cholesterol: 171 mg/dL (ref 0–200)
HDL: 33 mg/dL — ABNORMAL LOW (ref >=40)
LDL CALC: 107 mg/dL — AB (ref 0–99)
Triglycerides: 153 mg/dL — ABNORMAL HIGH (ref <150)
VLDL: 31 mg/dL (ref 0–40)

## 2015-03-22 LAB — HEMOGLOBIN A1C
Hgb A1c MFr Bld: 5.6 % (ref <5.7)
MEAN PLASMA GLUCOSE: 114 mg/dL (ref <117)

## 2015-03-22 LAB — IRON AND TIBC
%SAT: 22 % (ref 20–55)
Iron: 80 ug/dL (ref 42–165)
TIBC: 358 ug/dL (ref 215–435)
UIBC: 278 ug/dL (ref 125–400)

## 2015-03-22 LAB — MAGNESIUM: Magnesium: 1.7 mg/dL (ref 1.5–2.5)

## 2015-03-22 LAB — TSH: TSH: 1.831 u[IU]/mL (ref 0.350–4.500)

## 2015-03-22 NOTE — Patient Instructions (Signed)
Recommend Adult Low dose Aspirin or baby Aspirin 81 mg daily   To reduce risk of Colon Cancer 20 %,   Skin Cancer 26 % ,   Melanoma 46%   and   Pancreatic cancer 60%  ++++++++++++++++++  Vitamin D goal is between 70-100.   Please make sure that you are taking your Vitamin D as directed.   It is very important as a natural anti-inflammatory   helping hair, skin, and nails, as well as reducing stroke and heart attack risk.   It helps your bones and helps with mood.  It also decreases numerous cancer risks so please take it as directed.   Low Vit D is associated with a 200-300% higher risk for CANCER   and 200-300% higher risk for HEART   ATTACK  &  STROKE.    .....................................Marland Kitchen  It is also associated with higher death rate at younger ages,   autoimmune diseases like Rheumatoid arthritis, Lupus, Multiple Sclerosis.     Also many other serious conditions, like depression, Alzheimer's  Dementia, infertility, muscle aches, fatigue, fibromyalgia - just to name a few.  +++++++++++++++++++    Recommend the book "The END of DIETING" by Dr Excell Seltzer   & the book "The END of DIABETES " by Dr Excell Seltzer  At [facility]com - get book & Audio CD's     Being diabetic has a  300% increased risk for heart attack, stroke, cancer, and alzheimer- type vascular dementia. It is very important that you work harder with diet by avoiding all foods that are white. Avoid white rice (brown & wild rice is OK), white potatoes (sweetpotatoes in moderation is OK), White bread or wheat bread or anything made out of white flour like bagels, donuts, rolls, buns, biscuits, cakes, pastries, cookies, pizza crust, and pasta (made from white flour & egg whites) - vegetarian pasta or spinach or wheat pasta is OK. Multigrain breads like Arnold's or [employer], or multigrain sandwich thins or flatbreads.  Diet, exercise and weight loss can reverse and cure diabetes in the early  stages.  Diet, exercise and weight loss is very important in the control and prevention of complications of diabetes which affects every system in your body, ie. Brain - dementia/stroke, eyes - glaucoma/blindness, heart - heart attack/heart failure, kidneys - dialysis, stomach - gastric paralysis, intestines - malabsorption, nerves - severe painful neuritis, circulation - gangrene & loss of a leg(s), and finally cancer and Alzheimers.    I recommend avoid fried & greasy foods,  sweets/candy, white rice (brown or wild rice or Quinoa is OK), white potatoes (sweet potatoes are OK) - anything made from white flour - bagels, doughnuts, rolls, buns, biscuits,white and wheat breads, pizza crust and traditional pasta made of white flour & egg white(vegetarian pasta or spinach or wheat pasta is OK).  Multi-grain bread is OK - like multi-grain flat bread or sandwich thins. Avoid alcohol in excess. Exercise is also important.    Eat all the vegetables you want - avoid meat, especially red meat and dairy - especially cheese.  Cheese is the most concentrated form of trans-fats which is the worst thing to clog up our arteries. Veggie cheese is OK which can be found in the fresh produce section at [facility] or [employer] or [employer]  ++++++++++++++++++++++++++  Preventive Care for Adults  A healthy lifestyle and preventive care can promote health and wellness. Preventive health guidelines for men include the following key practices:  A routine yearly physical is  a good way to check with your health care provider about your health and preventative screening. It is a chance to share any concerns and updates on your health and to receive a thorough exam.  Visit your dentist for a routine exam and preventative care every 6 months. Brush your teeth twice a day and floss once a day. Good oral hygiene prevents tooth decay and gum disease.  The frequency of eye exams is based on your age, health, family medical  history, use of contact lenses, and other factors. Follow your health care provider's recommendations for frequency of eye exams.  Eat a healthy diet. Foods such as vegetables, fruits, whole grains, low-fat dairy products, and lean protein foods contain the nutrients you need without too many calories. Decrease your intake of foods high in solid fats, added sugars, and salt. Eat the right amount of calories for you.Get information about a proper diet from your health care provider, if necessary.  Regular physical exercise is one of the most important things you can do for your health. Most adults should get at least 150 minutes of moderate-intensity exercise (any activity that increases your heart rate and causes you to sweat) each week. In addition, most adults need muscle-strengthening exercises on 2 or more days a week.  Maintain a healthy weight. The body mass index (BMI) is a screening tool to identify possible weight problems. It provides an estimate of body fat based on height and weight. Your health care provider can find your BMI and can help you achieve or maintain a healthy weight.For adults 20 years and older:  A BMI below 18.5 is considered underweight.  A BMI of 18.5 to 24.9 is normal.  A BMI of 25 to 29.9 is considered overweight.  A BMI of 30 and above is considered obese.  Maintain normal blood lipids and cholesterol levels by exercising and minimizing your intake of saturated fat. Eat a balanced diet with plenty of fruit and vegetables. Blood tests for lipids and cholesterol should begin at age 91 and be repeated every 5 years. If your lipid or cholesterol levels are high, you are over 50, or you are at high risk for heart disease, you may need your cholesterol levels checked more frequently.Ongoing high lipid and cholesterol levels should be treated with medicines if diet and exercise are not working.  If you smoke, find out from your health care provider how to quit. If you  do not use tobacco, do not start.  Lung cancer screening is recommended for adults aged 46-80 years who are at high risk for developing lung cancer because of a history of smoking. A yearly low-dose CT scan of the lungs is recommended for people who have at least a 30-pack-year history of smoking and are a current smoker or have quit within the past 15 years. A pack year of smoking is smoking an average of 1 pack of cigarettes a day for 1 year (for example: 1 pack a day for 30 years or 2 packs a day for 15 years). Yearly screening should continue until the smoker has stopped smoking for at least 15 years. Yearly screening should be stopped for people who develop a health problem that would prevent them from having lung cancer treatment.  If you choose to drink alcohol, do not have more than 2 drinks per day. One drink is considered to be 12 ounces (355 mL) of beer, 5 ounces (148 mL) of wine, or 1.5 ounces (44 mL) of liquor.  High blood pressure causes heart disease and increases the risk of stroke. Your blood pressure should be checked. Ongoing high blood pressure should be treated with medicines, if weight loss and exercise are not effective.  If you are 33-62 years old, ask your health care provider if you should take aspirin to prevent heart disease.  Diabetes screening involves taking a blood sample to check your fasting blood sugar level. Testing should be considered at a younger age or be carried out more frequently if you are overweight and have at least 1 risk factor for diabetes.  Colorectal cancer can be detected and often prevented. Most routine colorectal cancer screening begins at the age of 24 and continues through age 55. However, your health care provider may recommend screening at an earlier age if you have risk factors for colon cancer. On a yearly basis, your health care provider may provide home test kits to check for hidden blood in the stool. Use of a small camera at the end of a  tube to directly examine the colon (sigmoidoscopy or colonoscopy) can detect the earliest forms of colorectal cancer. Talk to your health care provider about this at age 31, when routine screening begins. Direct exam of the colon should be repeated every 5-10 years through age 21, unless early forms of precancerous polyps or small growths are found.  Screening for abdominal aortic aneurysm (AAA)  are recommended for persons over age 37 who have history of hypertensionor who are current or former smokers.  Talk with your health care provider about prostate cancer screening.  Testicular cancer screening is recommended for adult males. Screening includes self-exam, a health care provider exam, and other screening tests. Consult with your health care provider about any symptoms you have or any concerns you have about testicular cancer.  Use sunscreen. Apply sunscreen liberally and repeatedly throughout the day. You should seek shade when your shadow is shorter than you. Protect yourself by wearing long sleeves, pants, a wide-brimmed hat, and sunglasses year round, whenever you are outdoors.  Once a month, do a whole-body skin exam, using a mirror to look at the skin on your back. Tell your health care provider about new moles, moles that have irregular borders, moles that are larger than a pencil eraser, or moles that have changed in shape or color.  Stay current with required vaccines (immunizations).  Influenza vaccine. All adults should be immunized every year.  Tetanus, diphtheria, and acellular pertussis (Td, Tdap) vaccine. An adult who has not previously received Tdap or who does not know his vaccine status should receive 1 dose of Tdap. This initial dose should be followed by tetanus and diphtheria toxoids (Td) booster doses every 10 years. Adults with an unknown or incomplete history of completing a 3-dose immunization series with Td-containing vaccines should begin or complete a primary  immunization series including a Tdap dose. Adults should receive a Td booster every 10 years.  Zoster vaccine. One dose is recommended for adults aged 54 years or older unless certain conditions are present.    Pneumococcal 13-valent conjugate (PCV13) vaccine. When indicated, a person who is uncertain of his immunization history and has no record of immunization should receive the PCV13 vaccine. An adult aged 80 years or older who has certain medical conditions and has not been previously immunized should receive 1 dose of PCV13 vaccine. This PCV13 should be followed with a dose of pneumococcal polysaccharide (PPSV23) vaccine. The PPSV23 vaccine dose should be obtained at least 8 weeks  after the dose of PCV13 vaccine. An adult aged 45 years or older who has certain medical conditions and previously received 1 or more doses of PPSV23 vaccine should receive 1 dose of PCV13. The PCV13 vaccine dose should be obtained 1 or more years after the last PPSV23 vaccine dose.    Pneumococcal polysaccharide (PPSV23) vaccine. When PCV13 is also indicated, PCV13 should be obtained first. All adults aged 1 years and older should be immunized. An adult younger than age 24 years who has certain medical conditions should be immunized. Any person who resides in a nursing home or long-term care facility should be immunized. An adult smoker should be immunized. People with an immunocompromised condition and certain other conditions should receive both PCV13 and PPSV23 vaccines. People with human immunodeficiency virus (HIV) infection should be immunized as soon as possible after diagnosis. Immunization during chemotherapy or radiation therapy should be avoided. Routine use of PPSV23 vaccine is not recommended for American Indians, [place] Natives, or people younger than 65 years unless there are medical conditions that require PPSV23 vaccine. When indicated, people who have unknown immunization and have no record of  immunization should receive PPSV23 vaccine. One-time revaccination 5 years after the first dose of PPSV23 is recommended for people aged 19-64 years who have chronic kidney failure, nephrotic syndrome, asplenia, or immunocompromised conditions. People who received 1-2 doses of PPSV23 before age 17 years should receive another dose of PPSV23 vaccine at age 74 years or later if at least 5 years have passed since the previous dose. Doses of PPSV23 are not needed for people immunized with PPSV23 at or after age 41 years.  Hepatitis A vaccine. Adults who wish to be protected from this disease, have certain high-risk conditions, work with hepatitis A-infected animals, work in hepatitis A research labs, or travel to or work in countries with a high rate of hepatitis A should be immunized. Adults who were previously unvaccinated and who anticipate close contact with an international adoptee during the first 60 days after arrival in the [place] from a country with a high rate of hepatitis A should be immunized.  Hepatitis B vaccine. Adults should be immunized if they wish to be protected from this disease, have certain high-risk conditions, may be exposed to blood or other infectious body fluids, are household contacts or sex partners of hepatitis B positive people, are clients or workers in certain care facilities, or travel to or work in countries with a high rate of hepatitis B.  Preventive Service / Frequency  Ages 64 to 29  Blood pressure check.  Lipid and cholesterol check.  Hepatitis C blood test.** / For any individual with known risks for hepatitis C.  Skin self-exam. / Monthly.  Influenza vaccine. / Every year.  Tetanus, diphtheria, and acellular pertussis (Tdap, Td) vaccine.** / Consult your health care provider. 1 dose of Td every 10 years.  HPV vaccine. / 3 doses over 6 months, if 50 or younger.  Measles, mumps, rubella (MMR) vaccine.** / You need at least 1 dose of MMR if you were  born in 1957 or later. You may also need a second dose.  Pneumococcal 13-valent conjugate (PCV13) vaccine.** / Consult your health care provider.  Pneumococcal polysaccharide (PPSV23) vaccine.** / 1 to 2 doses if you smoke cigarettes or if you have certain conditions.  Meningococcal vaccine.** / 1 dose if you are age 5 to 77 years and a Market researcher living in a residence hall, or have one of several  medical conditions. You may also need additional booster doses.  Hepatitis A vaccine.** / Consult your health care provider.  Hepatitis B vaccine.** / Consult your health care provider.

## 2015-03-22 NOTE — Progress Notes (Signed)
Patient ID: Dwayne Pittman, male   DOB: May 29, 1981, 34 y.o.   MRN: [id number]  Annual Comprehensive Examination  This very nice 34 y.o.male presents for complete physical.  Patient has been followed for labile HTN, Prediabetes, Hyperlipidemia, and Vitamin D Deficiency. Also patient has IBS and has been stable on treatment with Hyoscyamine.     HTN predates since 2011 - 2012 and is monitored expectantly. Patient's BP has been controlled at home.Today's BP: 124/80 mmHg. Patient denies any cardiac symptoms as chest pain, palpitations, shortness of breath, dizziness or ankle swelling.    Patient's hyperlipidemia is not controlled with diet. Last lipids were not at goal  with Total Chol 191, HDL 42, sl elevated Trig 193 and elevated LDL 110.    Patient is overweight (BMI 28.59) and is screened for prediabetes and patient denies reactive hypoglycemic symptoms, visual blurring, diabetic polys or paresthesias. Last A1c was 5.4% in June 2015.    Finally, patient has history of Vitamin D Deficiency of 45 in 2011  and last vitamin D was 46 in June 2015.      Medication Sig  . VIT D 5000  UNITS  Take 4,000 Units by mouth daily.    . hyoscyamine (LEVBID) 0.375 MG 12 hr  TAKE 1 TABLET BY MOUTH TWICE A DAY FOR SPASTIC COLON  . MAGNESIUM PO Take by mouth daily.  Marland Kitchen MILK THISTLE PO Take by mouth daily.   No Known Allergies   Past Medical History  Diagnosis Date  . Irritable bowel syndrome   . Allergy   . Hyperlipidemia   . Mixed hyperlipidemia   . Labile hypertension   . Hx of migraines    Health Maintenance  Topic Date Due  . INFLUENZA VACCINE  05/13/2015  . TETANUS/TDAP  04/11/2021  . HIV Screening  Completed   Immunization History  Administered Date(s) Administered  . PPD Test 06/36/2015  . Pneumococcal Polysaccharide-23 02/15/2009  . Tdap 04/12/2011   Past Surgical History  Procedure Laterality Date  . Duodenal surgery      as an infant  . Great toe ingrown toenail removal    .  Wisdom tooth extraction     Family History  Problem Relation Age of Onset  . Colon cancer Neg Hx   . Irritable bowel syndrome Mother   . Hypertension Mother   . Hyperlipidemia Mother   . Irritable bowel syndrome Cousin   . Diverticulitis Cousin    History   Social History  . Marital Status: Single    Spouse Name: N/A  . Number of Children: N/A  . Years of Education: N/A   Occupational History  . electrician    Social History Main Topics  . Smoking status: Former Smoker    Types: Cigarettes    Quit date: 10/12/2000  . Smokeless tobacco: Not on file  . Alcohol Use: 1.2 oz/week    36 Cans of beer per week     Comment: 2 beers per week  . Drug Use: No  . Sexual Activity: Not on file    ROS Constitutional: Denies fever, chills, weight loss/gain, headaches, insomnia,  night sweats or change in appetite. Does c/o fatigue. Eyes: Denies redness, blurred vision, diplopia, discharge, itchy or watery eyes.  ENT: Denies discharge, congestion, post nasal drip, epistaxis, sore throat, earache, hearing loss, dental pain, Tinnitus, Vertigo, Sinus pain or snoring.  Cardio: Denies chest pain, palpitations, irregular heartbeat, syncope, dyspnea, diaphoresis, orthopnea, PND, claudication or edema Respiratory: denies cough, dyspnea, DOE, pleurisy, hoarseness,  laryngitis or wheezing.  Gastrointestinal: Denies dysphagia, heartburn, reflux, water brash, pain, cramps, nausea, vomiting, bloating, diarrhea, constipation, hematemesis, melena, hematochezia, jaundice or hemorrhoids Genitourinary: Denies dysuria, frequency, urgency, nocturia, hesitancy, discharge, hematuria or flank pain Musculoskeletal: Denies arthralgia, myalgia, stiffness, Jt. Swelling, pain, limp or strain/sprain. Denies Falls. Skin: Denies puritis, rash, hives, warts, acne, eczema or change in skin lesion Neuro: No weakness, tremor, incoordination, spasms, paresthesia or pain Psychiatric: Denies confusion, memory loss or sensory  loss. Denies Depression. Endocrine: Denies change in weight, skin, hair change, nocturia, and paresthesia, diabetic polys, visual blurring or hyper / hypo glycemic episodes.  Heme/Lymph: No excessive bleeding, bruising or enlarged lymph nodes.  Physical Exam  BP 124/80   Pulse 62  Temp 98.2 F   Resp 18  Ht 5\' 8"    Wt 188 lb    BMI 28.59   General Appearance: Well nourished, in no apparent distress. Eyes: PERRLA, EOMs, conjunctiva no swelling or erythema, normal fundi and vessels. Sinuses: No frontal/maxillary tenderness ENT/Mouth: EACs patent / TMs  nl. Nares clear without erythema, swelling, mucoid exudates. Oral hygiene is good. No erythema, swelling, or exudate. Tongue normal, non-obstructing. Tonsils not swollen or erythematous. Hearing normal.  Neck: Supple, thyroid normal. No bruits, nodes or JVD. Respiratory: Respiratory effort normal.  BS equal and clear bilateral without rales, rhonci, wheezing or stridor. Cardio: Heart sounds are normal with regular rate and rhythm and no murmurs, rubs or gallops. Peripheral pulses are normal and equal bilaterally without edema. No aortic or femoral bruits. Chest: symmetric with normal excursions and percussion.  Abdomen: Flat, soft, with bowel sounds. Nontender, no guarding, rebound, hernias, masses, or organomegaly.  Lymphatics: Non tender without lymphadenopathy.  Genitourinary: No hernias. Nl male & Testes nl.  Musculoskeletal: Full ROM all peripheral extremities, joint stability, 5/5 strength, and normal gait. Skin: Warm and dry without rashes, lesions, cyanosis, clubbing or  ecchymosis.  Neuro: Cranial nerves intact, reflexes equal bilaterally. Normal muscle tone, no cerebellar symptoms. Sensation intact.  Pysch: Awake and oriented X 3 with normal affect, insight and judgment appropriate.   Assessment and Plan  1. Labile hypertension  - Microalbumin / creatinine urine ratio - EKG 12-Lead  2. Mixed hyperlipidemia  - Lipid  panel  3. Abnormal glucose  - Hemoglobin A1c - Insulin, random  4. Vitamin D deficiency  - Vit D  25 hydroxy   5. Hx of migraines   6. Irritable bowel syndrome   7. Screening for rectal cancer  - POC Hemoccult Bld/Stl (  8. Other fatigue  - Vitamin B12 - Testosterone - Iron and TIBC - TSH  9. Medication management  - Urine Microscopic - CBC with Differential/Platelet - BASIC METABOLIC PANEL WITH GFR - Hepatic function panel - Magnesium   Continue prudent diet as discussed, weight control, BP monitoring, regular exercise, and medications as discussed.  Discussed med effects and SE's. Routine screening labs and tests as requested with regular follow-up as recommended.  Over 40 minutes of exam, counseling &  chart review was performed

## 2015-03-23 LAB — TESTOSTERONE: Testosterone: 601 ng/dL (ref 300–890)

## 2015-03-23 LAB — MICROALBUMIN / CREATININE URINE RATIO
Creatinine, Urine: 168.9 mg/dL
MICROALB/CREAT RATIO: 6.5 mg/g (ref 0.0–30.0)
Microalb, Ur: 1.1 mg/dL (ref <2.0)

## 2015-03-23 LAB — URINALYSIS, MICROSCOPIC ONLY
Bacteria, UA: NONE SEEN
CASTS: NONE SEEN
Crystals: NONE SEEN
Squamous Epithelial / LPF: NONE SEEN

## 2015-03-23 LAB — INSULIN, RANDOM: INSULIN: 9.9 u[IU]/mL (ref 2.0–19.6)

## 2015-03-23 LAB — VITAMIN D 25 HYDROXY (VIT D DEFICIENCY, FRACTURES): Vit D, 25-Hydroxy: 56 ng/mL (ref 30–100)

## 2015-10-12 ENCOUNTER — Other Ambulatory Visit: Payer: Self-pay | Admitting: Internal Medicine

## 2015-10-15 ENCOUNTER — Other Ambulatory Visit: Payer: Self-pay | Admitting: *Deleted

## 2015-10-15 MED ORDER — HYOSCYAMINE SULFATE ER 0.375 MG PO TB12: ORAL_TABLET | ORAL | Status: DC

## 2016-04-07 ENCOUNTER — Encounter: Payer: Self-pay | Admitting: Internal Medicine

## 2016-04-07 ENCOUNTER — Ambulatory Visit (INDEPENDENT_AMBULATORY_CARE_PROVIDER_SITE_OTHER): Payer: Commercial Managed Care - PPO | Admitting: Internal Medicine

## 2016-04-07 VITALS — BP 124/84 | HR 76 | Temp 97.5°F | Resp 16 | Ht 68.25 in | Wt 192.0 lb

## 2016-04-07 DIAGNOSIS — Z1212 Encounter for screening for malignant neoplasm of rectum: Secondary | ICD-10-CM

## 2016-04-07 DIAGNOSIS — Z0001 Encounter for general adult medical examination with abnormal findings: Secondary | ICD-10-CM | POA: Diagnosis not present

## 2016-04-07 DIAGNOSIS — E782 Mixed hyperlipidemia: Secondary | ICD-10-CM

## 2016-04-07 DIAGNOSIS — K589 Irritable bowel syndrome without diarrhea: Secondary | ICD-10-CM

## 2016-04-07 DIAGNOSIS — R6889 Other general symptoms and signs: Secondary | ICD-10-CM

## 2016-04-07 DIAGNOSIS — R0989 Other specified symptoms and signs involving the circulatory and respiratory systems: Secondary | ICD-10-CM

## 2016-04-07 DIAGNOSIS — R5383 Other fatigue: Secondary | ICD-10-CM

## 2016-04-07 DIAGNOSIS — Z111 Encounter for screening for respiratory tuberculosis: Secondary | ICD-10-CM

## 2016-04-07 DIAGNOSIS — Z136 Encounter for screening for cardiovascular disorders: Secondary | ICD-10-CM | POA: Diagnosis not present

## 2016-04-07 DIAGNOSIS — I10 Essential (primary) hypertension: Secondary | ICD-10-CM

## 2016-04-07 DIAGNOSIS — E559 Vitamin D deficiency, unspecified: Secondary | ICD-10-CM

## 2016-04-07 DIAGNOSIS — R7309 Other abnormal glucose: Secondary | ICD-10-CM

## 2016-04-07 DIAGNOSIS — Z79899 Other long term (current) drug therapy: Secondary | ICD-10-CM

## 2016-04-07 LAB — CBC WITH DIFFERENTIAL/PLATELET
BASOS ABS: 0 {cells}/uL (ref 0–200)
Basophils Relative: 0 %
EOS PCT: 3 %
Eosinophils Absolute: 171 cells/uL (ref 15–500)
HCT: 43.6 % (ref 38.5–50.0)
Hemoglobin: 14.8 g/dL (ref 13.2–17.1)
Lymphocytes Relative: 39 %
Lymphs Abs: 2223 cells/uL (ref 850–3900)
MCH: 28.9 pg (ref 27.0–33.0)
MCHC: 33.9 g/dL (ref 32.0–36.0)
MCV: 85.2 fL (ref 80.0–100.0)
MONOS PCT: 7 %
MPV: 12.4 fL (ref 7.5–12.5)
Monocytes Absolute: 399 cells/uL (ref 200–950)
NEUTROS ABS: 2907 {cells}/uL (ref 1500–7800)
Neutrophils Relative %: 51 %
PLATELETS: 185 10*3/uL (ref 140–400)
RBC: 5.12 MIL/uL (ref 4.20–5.80)
RDW: 13.6 % (ref 11.0–15.0)
WBC: 5.7 10*3/uL (ref 3.8–10.8)

## 2016-04-07 LAB — BASIC METABOLIC PANEL WITH GFR
BUN: 15 mg/dL (ref 7–25)
CALCIUM: 9.2 mg/dL (ref 8.6–10.3)
CO2: 24 mmol/L (ref 20–31)
Chloride: 106 mmol/L (ref 98–110)
Creat: 0.96 mg/dL (ref 0.60–1.35)
GLUCOSE: 89 mg/dL (ref 65–99)
Potassium: 4.4 mmol/L (ref 3.5–5.3)
SODIUM: 138 mmol/L (ref 135–146)

## 2016-04-07 LAB — IRON AND TIBC
%SAT: 19 % (ref 15–60)
Iron: 67 ug/dL (ref 50–180)
TIBC: 349 ug/dL (ref 250–425)
UIBC: 282 ug/dL (ref 125–400)

## 2016-04-07 LAB — HEPATIC FUNCTION PANEL
ALK PHOS: 51 U/L (ref 40–115)
ALT: 12 U/L (ref 9–46)
AST: 17 U/L (ref 10–40)
Albumin: 4 g/dL (ref 3.6–5.1)
BILIRUBIN DIRECT: 0.1 mg/dL (ref <=0.2)
BILIRUBIN TOTAL: 0.8 mg/dL (ref 0.2–1.2)
Indirect Bilirubin: 0.7 mg/dL (ref 0.2–1.2)
Total Protein: 6.3 g/dL (ref 6.1–8.1)

## 2016-04-07 LAB — HEMOGLOBIN A1C
Hgb A1c MFr Bld: 5.4 % (ref <5.7)
Mean Plasma Glucose: 108 mg/dL

## 2016-04-07 LAB — LIPID PANEL
Cholesterol: 176 mg/dL (ref 125–200)
HDL: 34 mg/dL — AB (ref >=40)
LDL Cholesterol: 99 mg/dL (ref <130)
Total CHOL/HDL Ratio: 5.2 Ratio — ABNORMAL HIGH (ref <=5.0)
Triglycerides: 216 mg/dL — ABNORMAL HIGH (ref <150)
VLDL: 43 mg/dL — ABNORMAL HIGH (ref <30)

## 2016-04-07 LAB — VITAMIN B12: Vitamin B-12: 516 pg/mL (ref 200–1100)

## 2016-04-07 LAB — MAGNESIUM: MAGNESIUM: 1.8 mg/dL (ref 1.5–2.5)

## 2016-04-07 LAB — TSH: TSH: 1 m[IU]/L (ref 0.40–4.50)

## 2016-04-07 NOTE — Patient Instructions (Signed)

## 2016-04-07 NOTE — Progress Notes (Signed)
Patient ID: Dwayne Pittman, male   DOB: 1981-09-26, 35 y.o.   MRN: [id number]  [facility]   Lucky CowboyWilliam Allsion Nogales, M.D.    Dyanne CarrelAmanda R. Steffanie Dunnollier, P.A.-C      Terri Piedraourtney Forcucci, P.A.-C   [facility]                [address]                [place], [place] [id number] Telephone [phone] Telefax [phone] _________________________________  Annual  Screening/Preventative Visit And Comprehensive Evaluation & Examination     This very nice 35 y.o. MWM presents for a Wellness/Preventative Visit & comprehensive evaluation and management of multiple medical co-morbidities.  Patient has been followed expectantly for hx/o labile  HTN,   Prediabetes, Hyperlipidemia and Vitamin D Deficiency. Patient also has long hx/o IBS and is apparently controlled on 1 to 2 tabs /daily of his hyoscyamine.      Patient has hx/o labile elevated BP - predating circa 2011 HTN. Patient's BP has been controlled and today's BP: 124/84 mmHg. Patient denies any cardiac symptoms as chest pain, palpitations, shortness of breath, dizziness or ankle swelling.     Patient's hyperlipidemia is near controlled with diet. Patient denies myalgias or other medication SE's. Last lipids were near goal with T Chol 171, HDL 33- low , Trig 153 and elevated LDL Chol 107 in June 2016.       Patient is over weight  (BMI 29)  and is screened expectantly for prediabetes and patient denies reactive hypoglycemic symptoms, visual blurring, diabetic polys or paresthesias. Last A1c was 5.6% in June 2016.      Finally, patient has history of Vitamin D Deficiency of "45" on treatment in 2011 and last vitamin D was 56 in June 2016.    Medication Sig  . VIT D 5000 UNITS  Take 4,000 Units by mouth daily.    . hyoscyamine (LEVBID) 0.375 MG 12 hr  TAKE 1 TABLET TWICE A DAY FOR SPASTIC COLON  . MAGNESIUM Take by mouth daily.  Marland Kitchen. MILK THISTLE Take by mouth daily.   No Known Allergies    Past Medical History  Diagnosis Date  . Irritable bowel syndrome   . Allergy   . Hyperlipidemia   . Mixed hyperlipidemia   . Labile hypertension   . Hx of migraines    Health Maintenance  Topic Date Due  . INFLUENZA VACCINE  05/12/2016  . TETANUS/TDAP  04/11/2021  . HIV Screening  Completed   Immunization History  Administered Date(s) Administered  . PPD Test 03/15/2014  . Pneumococcal Polysaccharide-23 02/15/2009  . Tdap 04/12/2011   Past Surgical History  Procedure Laterality Date  . Duodenal surgery      as an infant  . Great toe ingrown toenail removal    . Wisdom tooth extraction     Family History  Problem Relation Age of Onset  . Colon cancer Neg Hx   . Irritable bowel syndrome Mother   . Hypertension Mother   . Hyperlipidemia Mother   . Irritable bowel syndrome Cousin   . Diverticulitis Cousin    Social History   Social History  . Marital Status: Single    Spouse Name: N/A  . Number of Children: N/A  . Years of Education: N/A   Occupational History  . electrician    Social History Main Topics  . Smoking status: Former Smoker    Types: Cigarettes  Quit date: 10/12/2000  . Smokeless tobacco: Not on file  . Alcohol Use: 1.2 oz/week    2 Cans of beer per week     Comment: 2 beers per week  . Drug Use: No  . Sexual Activity: Active    ROS Constitutional: Denies fever, chills, weight loss/gain, headaches, insomnia,  night sweats or change in appetite. Does c/o fatigue. Eyes: Denies redness, blurred vision, diplopia, discharge, itchy or watery eyes.  ENT: Denies discharge, congestion, post nasal drip, epistaxis, sore throat, earache, hearing loss, dental pain, Tinnitus, Vertigo, Sinus pain or snoring.  Cardio: Denies chest pain, palpitations, irregular heartbeat, syncope, dyspnea, diaphoresis, orthopnea, PND, claudication or edema Respiratory: denies cough, dyspnea, DOE, pleurisy, hoarseness, laryngitis or wheezing.  Gastrointestinal: Denies  dysphagia, heartburn, reflux, water brash, pain, cramps, nausea, vomiting, bloating, diarrhea, constipation, hematemesis, melena, hematochezia, jaundice or hemorrhoids Genitourinary: Denies dysuria, frequency, urgency, nocturia, hesitancy, discharge, hematuria or flank pain Musculoskeletal: Denies arthralgia, myalgia, stiffness, Jt. Swelling, pain, limp or strain/sprain. Denies Falls. Skin: Denies puritis, rash, hives, warts, acne, eczema or change in skin lesion Neuro: No weakness, tremor, incoordination, spasms, paresthesia or pain Psychiatric: Denies confusion, memory loss or sensory loss. Denies Depression. Endocrine: Denies change in weight, skin, hair change, nocturia, and paresthesia, diabetic polys, visual blurring or hyper / hypo glycemic episodes.  Heme/Lymph: No excessive bleeding, bruising or enlarged lymph nodes.  Physical Exam  BP 124/84 mmHg  Pulse 76  Temp(Src) 97.5 F (36.4 C)  Resp 16  Ht 5' 8.25" (1.734 m)  Wt 192 lb (87.091 kg)  BMI 28.97 kg/m2  General Appearance: Well nourished, in no apparent distress.  Eyes: PERRLA, EOMs, conjunctiva no swelling or erythema, normal fundi and vessels. Sinuses: No frontal/maxillary tenderness ENT/Mouth: EACs patent / TMs  nl. Nares clear without erythema, swelling, mucoid exudates. Oral hygiene is good. No erythema, swelling, or exudate. Tongue normal, non-obstructing. Tonsils not swollen or erythematous. Hearing normal.  Neck: Supple, thyroid normal. No bruits, nodes or JVD. Respiratory: Respiratory effort normal.  BS equal and clear bilateral without rales, rhonci, wheezing or stridor. Cardio: Heart sounds are normal with regular rate and rhythm and no murmurs, rubs or gallops. Peripheral pulses are normal and equal bilaterally without edema. No aortic or femoral bruits. Chest: symmetric with normal excursions and percussion.  Abdomen: Soft, with Nl bowel sounds. Nontender, no guarding, rebound, hernias, masses, or organomegaly.   Lymphatics: Non tender without lymphadenopathy.  Genitourinary: No hernias.Testes nl. DRE - deferred for age.  Musculoskeletal: Full ROM all peripheral extremities, joint stability, 5/5 strength, and normal gait. Skin: Warm and dry without rashes, lesions, cyanosis, clubbing or  ecchymosis.  Neuro: Cranial nerves intact, reflexes equal bilaterally. Normal muscle tone, no cerebellar symptoms. Sensation intact.  Pysch: Alert and oriented X 3 with normal affect, insight and judgment appropriate.   Assessment and Plan  1. Annual Preventative/Screening Exam    - Microalbumin / creatinine urine ratio - EKG 12-Lead - POC Hemoccult Bld/Stl  - Urinalysis, Routine w reflex microscopic  - Vitamin B12 - Iron and TIBC - Testosterone - CBC with Differential/Platelet - BASIC METABOLIC PANEL WITH GFR - Hepatic function panel - Magnesium - Lipid panel - TSH - Hemoglobin A1c - Insulin, random - VITAMIN D 25 Hydroxy  2. Labile hypertension  - Microalbumin / creatinine urine ratio - EKG 12-Lead - Lipid panel - TSH  3. Mixed hyperlipidemia  - TSH  4. Abnormal glucose  - Hemoglobin A1c - Insulin, random  5. Vitamin D deficiency  - VITAMIN  D 25 Hydroxy   6. Irritable bowel syndrome   7. Screening for rectal cancer  - POC Hemoccult Bld/Stl   8. Other fatigue  - Vitamin B12 - Iron and TIBC - Testosterone - CBC with Differential/Platelet - TSH  9. Medication management  - Urinalysis, Routine w reflex microscopic  - CBC with Differential/Platelet - BASIC METABOLIC PANEL WITH GFR - Hepatic function panel - Magnesium  10. Screening examination for pulmonary tuberculosis  - PPD  Continue prudent diet as discussed, weight control, BP monitoring, regular exercise, and medications as discussed.  Discussed med effects and SE's. Routine screening labs and tests as requested with regular follow-up as recommended. Over 40 minutes of exam, counseling, chart review and high  complex critical decision making was performed

## 2016-04-08 LAB — URINALYSIS, ROUTINE W REFLEX MICROSCOPIC
BILIRUBIN URINE: NEGATIVE
GLUCOSE, UA: NEGATIVE
HGB URINE DIPSTICK: NEGATIVE
KETONES UR: NEGATIVE
Leukocytes, UA: NEGATIVE
Nitrite: NEGATIVE
PH: 5.5 (ref 5.0–8.0)
Protein, ur: NEGATIVE
SPECIFIC GRAVITY, URINE: 1.019 (ref 1.001–1.035)

## 2016-04-08 LAB — VITAMIN D 25 HYDROXY (VIT D DEFICIENCY, FRACTURES): VIT D 25 HYDROXY: 50 ng/mL (ref 30–100)

## 2016-04-08 LAB — MICROALBUMIN / CREATININE URINE RATIO
CREATININE, URINE: 144 mg/dL (ref 20–370)
MICROALB UR: 0.5 mg/dL
Microalb Creat Ratio: 3 mcg/mg creat (ref <30)

## 2016-04-08 LAB — TESTOSTERONE: TESTOSTERONE: 488 ng/dL (ref 250–827)

## 2016-04-08 LAB — INSULIN, RANDOM: Insulin: 22.6 u[IU]/mL — ABNORMAL HIGH (ref 2.0–19.6)

## 2016-11-30 ENCOUNTER — Emergency Department (HOSPITAL_COMMUNITY)
Admission: EM | Admit: 2016-11-30 | Discharge: 2016-11-30 | Disposition: A | Discharge status: Home / Self Care | Payer: Commercial Managed Care - PPO | Attending: Emergency Medicine | Admitting: Emergency Medicine

## 2016-11-30 ENCOUNTER — Encounter (HOSPITAL_COMMUNITY): Payer: Self-pay

## 2016-11-30 ENCOUNTER — Emergency Department (HOSPITAL_COMMUNITY): Payer: Commercial Managed Care - PPO

## 2016-11-30 DIAGNOSIS — Z79899 Other long term (current) drug therapy: Secondary | ICD-10-CM | POA: Diagnosis not present

## 2016-11-30 DIAGNOSIS — I10 Essential (primary) hypertension: Secondary | ICD-10-CM | POA: Diagnosis not present

## 2016-11-30 DIAGNOSIS — Z87891 Personal history of nicotine dependence: Secondary | ICD-10-CM | POA: Insufficient documentation

## 2016-11-30 DIAGNOSIS — R109 Unspecified abdominal pain: Secondary | ICD-10-CM | POA: Diagnosis present

## 2016-11-30 DIAGNOSIS — R630 Anorexia: Secondary | ICD-10-CM | POA: Diagnosis not present

## 2016-11-30 DIAGNOSIS — R1031 Right lower quadrant pain: Secondary | ICD-10-CM | POA: Diagnosis not present

## 2016-11-30 LAB — URINALYSIS, ROUTINE W REFLEX MICROSCOPIC
BILIRUBIN URINE: NEGATIVE
Glucose, UA: NEGATIVE mg/dL
HGB URINE DIPSTICK: NEGATIVE
KETONES UR: NEGATIVE mg/dL
Leukocytes, UA: NEGATIVE
NITRITE: NEGATIVE
PROTEIN: NEGATIVE mg/dL
Specific Gravity, Urine: 1.015 (ref 1.005–1.030)
pH: 6 (ref 5.0–8.0)

## 2016-11-30 LAB — CBC
HCT: 44.1 % (ref 39.0–52.0)
Hemoglobin: 15.3 g/dL (ref 13.0–17.0)
MCH: 29.3 pg (ref 26.0–34.0)
MCHC: 34.7 g/dL (ref 30.0–36.0)
MCV: 84.3 fL (ref 78.0–100.0)
PLATELETS: 235 10*3/uL (ref 150–400)
RBC: 5.23 MIL/uL (ref 4.22–5.81)
RDW: 12.9 % (ref 11.5–15.5)
WBC: 14.7 10*3/uL — ABNORMAL HIGH (ref 4.0–10.5)

## 2016-11-30 LAB — COMPREHENSIVE METABOLIC PANEL
ALBUMIN: 4.1 g/dL (ref 3.5–5.0)
ALT: 27 U/L (ref 17–63)
AST: 27 U/L (ref 15–41)
Alkaline Phosphatase: 53 U/L (ref 38–126)
Anion gap: 8 (ref 5–15)
BILIRUBIN TOTAL: 0.7 mg/dL (ref 0.3–1.2)
BUN: 10 mg/dL (ref 6–20)
CALCIUM: 9.8 mg/dL (ref 8.9–10.3)
CO2: 26 mmol/L (ref 22–32)
CREATININE: 0.99 mg/dL (ref 0.61–1.24)
Chloride: 104 mmol/L (ref 101–111)
GFR calc non Af Amer: >60 mL/min (ref >60)
GLUCOSE: 99 mg/dL (ref 65–99)
Potassium: 4.5 mmol/L (ref 3.5–5.1)
SODIUM: 138 mmol/L (ref 135–145)
TOTAL PROTEIN: 7.2 g/dL (ref 6.5–8.1)

## 2016-11-30 LAB — LIPASE, BLOOD: Lipase: 79 U/L — ABNORMAL HIGH (ref 11–51)

## 2016-11-30 MED ORDER — MORPHINE SULFATE (PF) 4 MG/ML IV SOLN
4.0000 mg | Freq: Once | INTRAVENOUS | Status: AC
  Administered 2016-11-30: 4 mg via INTRAVENOUS
  Filled 2016-11-30: qty 1

## 2016-11-30 MED ORDER — ONDANSETRON 4 MG PO TBDP: 4.0000 mg | ORAL_TABLET | Freq: Three times a day (TID) | ORAL | 0 refills | Status: DC | PRN

## 2016-11-30 MED ORDER — IOPAMIDOL (ISOVUE-300) INJECTION 61%
INTRAVENOUS | Status: AC
  Filled 2016-11-30: qty 100

## 2016-11-30 MED ORDER — ONDANSETRON HCL 4 MG/2ML IJ SOLN
4.0000 mg | Freq: Once | INTRAMUSCULAR | Status: AC
  Administered 2016-11-30: 4 mg via INTRAVENOUS
  Filled 2016-11-30: qty 2

## 2016-11-30 MED ORDER — IBUPROFEN 600 MG PO TABS: 600.0000 mg | ORAL_TABLET | Freq: Four times a day (QID) | ORAL | 0 refills | Status: DC | PRN

## 2016-11-30 MED ORDER — IOPAMIDOL (ISOVUE-300) INJECTION 61%
100.0000 mL | Freq: Once | INTRAVENOUS | Status: AC | PRN
  Administered 2016-11-30: 100 mL via INTRAVENOUS

## 2016-11-30 NOTE — ED Triage Notes (Signed)
Pt states that he has been experiencing R sided abdominal pain that radiates around to his R flank. He also endorses nausea and lack of appetite. Denies urinary issues. LBM this morning. A&Ox4. Ambulatory.

## 2016-11-30 NOTE — ED Provider Notes (Signed)
[facility]-EMERGENCY DEPT Provider Note   CSN: [id number] Arrival date & time: 11/30/16  1914  By signing my name below, I, Alyssa Grove, attest that this documentation has been prepared under the direction and in the presence of [employer], PA-C. Electronically Signed: Alyssa Grove, ED Scribe. 11/30/16. 10:30 PM.   History   Chief Complaint Chief Complaint  Patient presents with  . Abdominal Pain  . Flank Pain   The history is provided by the patient. No language interpreter was used.   HPI Comments: Dwayne Pittman is a 36 y.o. male with PMHx of Hyperlipidemia and Irritable bowel syndrome who presents to the Emergency Department complaining of gradual onset and worsening, 4/10, constant, moderate right sided abdominal pain beginning this morning at 6 AM. Pain is exacerbated with movement and radiates around to his right flank. Pt reports associated decreased appetite, right flank pain, pallor, nausea. He had a normal bowel movement today. No treatments tried. Pt has PSHx of duodenal surgery when he was 30 weeks old. Pt states he was recently sick a viral illness. He denies vomiting, hematuria, dysuria, scrotal swelling, testicular pain or tenderness, penile discharge, and fever.   Past Medical History:  Diagnosis Date  . Allergy   . Hx of migraines   . Hyperlipidemia   . Irritable bowel syndrome   . Labile hypertension   . Mixed hyperlipidemia     Patient Active Problem List   Diagnosis Date Noted  . Abnormal glucose 03/22/2015  . Medication management 03/22/2015  . Vitamin D deficiency 03/15/2014  . Mixed hyperlipidemia   . Labile hypertension   . Hx of migraines   . Irritable bowel syndrome 01/19/2011  . Gilbert's syndrome 01/19/2011    Past Surgical History:  Procedure Laterality Date  . duodenal surgery     as an infant  . great toe ingrown toenail removal    . WISDOM TOOTH EXTRACTION      Home Medications    Prior to Admission medications   Medication Sig  Start Date End Date Taking? Authorizing Provider  Cholecalciferol ([employer] VIT D 5000 HIGH-POTENCY) 5000 UNITS capsule Take 4,000 Units by mouth daily.     Yes Historical Provider, MD  hyoscyamine (LEVBID) 0.375 MG 12 hr tablet TAKE 1 TABLET TWICE A DAY FOR SPASTIC COLON Patient taking differently: Take 0.375 mg by mouth 2 (two) times daily. FOR SPASTIC COLON 10/15/15  Yes Lucky Cowboy, MD  MAGNESIUM PO Take 1 tablet by mouth daily.    Yes Historical Provider, MD  MILK THISTLE PO Take 1 tablet by mouth daily.    Yes Historical Provider, MD  ibuprofen (ADVIL,MOTRIN) 600 MG tablet Take 1 tablet (600 mg total) by mouth every 6 (six) hours as needed. 11/30/16   Antony Madura, PA-C  ondansetron (ZOFRAN ODT) 4 MG disintegrating tablet Take 1 tablet (4 mg total) by mouth every 8 (eight) hours as needed for nausea or vomiting. 11/30/16   Antony Madura, PA-C    Family History Family History  Problem Relation Age of Onset  . Colon cancer Neg Hx   . Irritable bowel syndrome Mother   . Hypertension Mother   . Hyperlipidemia Mother   . Irritable bowel syndrome Cousin   . Diverticulitis Cousin     Social History Social History  Substance Use Topics  . Smoking status: Former Smoker    Types: Cigarettes    Quit date: 10/12/2000  . Smokeless tobacco: Not on file  . Alcohol use 1.2 oz/week    2  Cans of beer per week     Comment: 2 beers per week   Allergies   Patient has no known allergies.  Review of Systems Review of Systems  A complete 10 system review of systems was obtained and all systems are negative except as noted in the HPI and PMH.     Physical Exam Updated Vital Signs BP 129/84 (BP Location: Left Arm)   Pulse 81   Temp 98.5 F (36.9 C) (Oral)   Resp 14   Ht 5\' 9"  (1.753 m)   Wt 88.5 kg   SpO2 96%   BMI 28.80 kg/m   Physical Exam  Constitutional: He is oriented to person, place, and time. He appears well-developed and well-nourished. No distress.  Nontoxic and in NAD  HENT:    Head: Normocephalic and atraumatic.  Eyes: Conjunctivae and EOM are normal. No scleral icterus.  Neck: Normal range of motion.  Cardiovascular: Normal rate, regular rhythm and intact distal pulses.   Pulmonary/Chest: Effort normal. No respiratory distress. He has no wheezes. He has no rales.  Respirations even and unlabored  Abdominal: Soft. He exhibits no distension and no mass. There is tenderness. There is no guarding.  RLQ TTP. Minimal voluntary guarding. No distension. Abdomen soft. No peritoneal signs.  Musculoskeletal: Normal range of motion.  Neurological: He is alert and oriented to person, place, and time. He exhibits normal muscle tone. Coordination normal.  Patient moving all extremities.  Skin: Skin is warm and dry. No rash noted. He is not diaphoretic. No erythema. No pallor.  Psychiatric: He has a normal mood and affect. His behavior is normal.  Nursing note and vitals reviewed.    ED Treatments / Results  DIAGNOSTIC STUDIES: Oxygen Saturation is 93% on RA, low by my interpretation.    COORDINATION OF CARE: 10:27 PM Discussed treatment plan with pt at bedside which includes Zofran,  Morphine, and CT Abdomen and pt agreed to plan.  Labs (all labs ordered are listed, but only abnormal results are displayed) Labs Reviewed  LIPASE, BLOOD - Abnormal; Notable for the following:       Result Value   Lipase 79 (*)    All other components within normal limits  CBC - Abnormal; Notable for the following:    WBC 14.7 (*)    All other components within normal limits  COMPREHENSIVE METABOLIC PANEL  URINALYSIS, ROUTINE W REFLEX MICROSCOPIC    EKG  EKG Interpretation None       Radiology Ct Abdomen Pelvis W Contrast  Result Date: 11/30/2016 CLINICAL DATA:  Moderate right-sided abdominal pain EXAM: CT ABDOMEN AND PELVIS WITH CONTRAST TECHNIQUE: Multidetector CT imaging of the abdomen and pelvis was performed using the standard protocol following bolus administration of  intravenous contrast. CONTRAST:  100mL ISOVUE-300 IOPAMIDOL (ISOVUE-300) INJECTION 61% COMPARISON:  None. FINDINGS: Lower chest: No acute abnormality. Hepatobiliary: No focal liver abnormality is seen. No gallstones, gallbladder wall thickening, or biliary dilatation. Pancreas: Unremarkable. No pancreatic ductal dilatation or surrounding inflammatory changes. Spleen: Normal in size without focal abnormality. Calcified granuloma Adrenals/Urinary Tract: Adrenal glands are unremarkable. Kidneys are normal, without renal calculi, focal lesion, or hydronephrosis. Bladder is unremarkable. Stomach/Bowel: Stomach is within normal limits. Appendix appears normal. No evidence of bowel wall thickening, distention, or inflammatory changes. Vascular/Lymphatic: No significant vascular findings are present. No enlarged abdominal or pelvic lymph nodes. Reproductive: Prostate is unremarkable. Other: Tiny fat containing umbilical hernia. No free air or free fluid. Musculoskeletal: No acute or significant osseous findings. IMPRESSION: No  CT evidence for acute intra-abdominal or pelvic pathology. Negative appendix. Electronically Signed   By: Jasmine Pang M.D.   On: 11/30/2016 23:12    Procedures Procedures (including critical care time)  Medications Ordered in ED Medications  iopamidol (ISOVUE-300) 61 % injection (not administered)  morphine 4 MG/ML injection 4 mg (4 mg Intravenous Given 11/30/16 2247)  ondansetron (ZOFRAN) injection 4 mg (4 mg Intravenous Given 11/30/16 2247)  iopamidol (ISOVUE-300) 61 % injection 100 mL (100 mLs Intravenous Contrast Given 11/30/16 2253)     Initial Impression / Assessment and Plan / ED Course  I have reviewed the triage vital signs and the nursing notes.  Pertinent labs & imaging results that were available during my care of the patient were reviewed by me and considered in my medical decision making (see chart for details).     36 year old male presents to the emergency  department for evaluation of right flank pain and right lower quadrant pain with associated anorexia. He has not had any nausea, vomiting, or fever. He reports a normal bowel movement this morning. Patient with no reproducible pain to his right flank or low back. He was mildly tender in his right lower quadrant. A CT scan was obtained to rule out appendicitis given mild leukocytosis and left shift. CT negative for evidence of acute abdominal or pelvic process today.  Suspect mild leukocytosis to be secondary to recent flulike illness. Remainder of laboratory workup is reassuring and noncontributory. CT without evidence of kidney stone and onset and quality of pain is fairly atypical for this. Question musculoskeletal etiology vs mesenteric adenitis associated with recent viral illness. Plan to manage supportively with NSAIDs. Patient referred to his primary care doctor for follow-up. Return precautions discussed and provided. Patient discharged in stable condition with no unaddressed concerns.   Final Clinical Impressions(s) / ED Diagnoses   Final diagnoses:  Right flank pain  Right lower quadrant abdominal pain  Anorexia    New Prescriptions New Prescriptions   IBUPROFEN (ADVIL,MOTRIN) 600 MG TABLET    Take 1 tablet (600 mg total) by mouth every 6 (six) hours as needed.   ONDANSETRON (ZOFRAN ODT) 4 MG DISINTEGRATING TABLET    Take 1 tablet (4 mg total) by mouth every 8 (eight) hours as needed for nausea or vomiting.    I personally performed the services described in this documentation, which was scribed in my presence. The recorded information has been reviewed and is accurate.      Antony Madura, PA-C 11/30/16 2352    Nira Conn, MD 12/01/16 [phone]

## 2016-12-02 ENCOUNTER — Encounter: Payer: Self-pay | Admitting: Internal Medicine

## 2016-12-02 ENCOUNTER — Ambulatory Visit (INDEPENDENT_AMBULATORY_CARE_PROVIDER_SITE_OTHER): Payer: Commercial Managed Care - PPO | Admitting: Internal Medicine

## 2016-12-02 VITALS — BP 114/72 | HR 88 | Temp 97.3°F | Resp 16 | Ht 68.25 in | Wt 191.2 lb

## 2016-12-02 DIAGNOSIS — R1011 Right upper quadrant pain: Secondary | ICD-10-CM

## 2016-12-02 DIAGNOSIS — B349 Viral infection, unspecified: Secondary | ICD-10-CM | POA: Diagnosis not present

## 2016-12-02 NOTE — Progress Notes (Signed)
  Subjective:    Patient ID: Dwayne ManisMichael D Abdullah, male    DOB: 02/24/1981, 36 y.o.   MRN: [id number]  HPI   This very nice 36 yo MWM was evaluated in the ER on 11/30/2016 for sub-acute onset of sharp stabbing upper Right abdominal pains radiating circumferentially to the Right back. He initially did have anorexia, nausea w/o emesis and in the ER WBC was elevated at 14,700 and Abd/Pelvic CT scan was unremarkable. Patient returned home and has had intermittent low grade fever and discomfort. He has been taking fluids , minimal solids and has had no other sx's as upper or lower GI sx's or GU sx's  Motrin 600 mg controls the discomfort.  Medication Sig  . VIT D 5000 UNITS Take 4,000 Units by mouth daily.    . hyoscyamine  0.375 MG 12 hr TAKE 1 TABLET TWICE A DAY FOR SPASTIC COLON   . ibuprofen 600 MG tablet Take 1 tab every 6  hours as needed.  Marland Kitchen. MAGNESIUM Take 1 tab daily.   Marland Kitchen. MILK THISTLE Take 1 tab daily.   . Ondansetron-ODT 4 MG  Take 1 tablet (4 mg total) by mouth every 8 (eight) hours as needed for nausea or vomiting.   No Known Allergies  Past Medical History:  Diagnosis Date  . Allergy   . Hx of migraines   . Hyperlipidemia   . Irritable bowel syndrome   . Labile hypertension   . Mixed hyperlipidemia    Review of Systems   10 point systems review negative except as above.    Objective:   Physical Exam  BP 114/72   Pulse 88   Temp 97.3 F (36.3 C)   Resp 16   Ht 5' 8.25" (1.734 m)   Wt 191 lb 3.2 oz (86.7 kg)   BMI 28.86 kg/m   HEENT - WNL. Neck - supple. Nl Thyroid. Carotids 2+. Chest - Clear equal BS w/o Rales, rhonchi, wheezes. Cor - Nl HS. RRR w/o sig murmur. Abd - No palpable masses, guarding and sl tenderness along the lateral Right hypochondrial area.  tenderness. BS nl. MS- FROM w/o deformities.  Gait Nl. Neuro - Nl w/o focal abnormalities.    Assessment & Plan:    1. Right upper quadrant abdominal pain   2. Viral syndrome  - patient reassured   -  Liquid advanced to soft diet discussed   - ROV is sx's worsen

## 2016-12-02 NOTE — Patient Instructions (Signed)
Viral Gastroenteritis, Adult Introduction Viral gastroenteritis is also known as the stomach flu. This condition is caused by certain germs (viruses). These germs can be passed from person to person very easily (are very contagious). This condition can cause sudden watery poop (diarrhea), fever, and throwing up (vomiting). Having watery poop and throwing up can make you feel weak and cause you to get dehydrated. Dehydration can make you tired and thirsty, make you have a dry mouth, and make it so you pee (urinate) less often. Older adults and people with other diseases or a weak defense system (immune system) are at higher risk for dehydration. It is important to replace the fluids that you lose from having watery poop and throwing up. Follow these instructions at home: Follow instructions from your doctor about how to care for yourself at home. Eating and drinking Follow these instructions as told by your doctor:  Take an oral rehydration solution (ORS). This is a drink that is sold at pharmacies and stores.  Drink clear fluids in small amounts as you are able, such as:  Water.  Ice chips.  Diluted fruit juice.  Low-calorie sports drinks.  Eat bland, easy-to-digest foods in small amounts as you are able, such as:  Bananas.  Applesauce.  Rice.  Low-fat (lean) meats.  Toast.  Crackers.  Avoid fluids that have a lot of sugar or caffeine in them.  Avoid alcohol.  Avoid spicy or fatty foods. General instructions  Drink enough fluid to keep your pee (urine) clear or pale yellow.  Wash your hands often. If you cannot use soap and water, use hand sanitizer.  Make sure that all people in your home wash their hands well and often.  Rest at home while you get better.  Take over-the-counter and prescription medicines only as told by your doctor.  Watch your condition for any changes.  Take a warm bath to help with any burning or pain from having watery poop.  Keep all  follow-up visits as told by your doctor. This is important. Contact a doctor if:  You cannot keep fluids down.  Your symptoms get worse.  You have new symptoms.  You feel light-headed or dizzy.  You have muscle cramps. Get help right away if:  You have chest pain.  You feel very weak or you pass out (faint).  You see blood in your throw-up.  Your throw-up looks like coffee grounds.  You have bloody or black poop (stools) or poop that look like tar.  You have a very bad headache, a stiff neck, or both.  You have a rash.  You have very bad pain, cramping, or bloating in your belly (abdomen).  You have trouble breathing.  You are breathing very quickly.  Your heart is beating very quickly.  Your skin feels cold and clammy.  You feel confused.  You have pain when you pee.  You have signs of dehydration, such as:  Dark pee, hardly any pee, or no pee.  Cracked lips.  Dry mouth.  Sunken eyes.  Sleepiness.  Weakness. This information is not intended to replace advice given to you by your health care provider. Make sure you discuss any questions you have with your health care provider. Document Released: 03/16/2008 Document Revised: 04/17/2016 Document Reviewed: 06/04/2015  2017 Elsevier  

## 2016-12-23 ENCOUNTER — Other Ambulatory Visit: Payer: Self-pay | Admitting: *Deleted

## 2016-12-23 MED ORDER — HYOSCYAMINE SULFATE ER 0.375 MG PO TB12: ORAL_TABLET | ORAL | 1 refills | Status: DC

## 2017-05-02 NOTE — Progress Notes (Signed)
[facility]   Lucky Cowboy, M.D.      Dyanne Carrel. Steffanie Dunn, P.A.-C [facility]                [address]                [place], [place]. [address] Telephone [phone] Telefax [phone] Annual  Screening/Preventative Visit  & Comprehensive Evaluation & Examination      This very nice 36 y.o. MWM presents for a Screening/Preventative Visit & comprehensive evaluation and management of multiple medical co-morbidities.  Patient also has IBS controlled for the mast part with diet, stress reduction and occasional hyoscyamine.     Patient has hx/o labile HTN and has also been followed expectantly for Prediabetes, Hyperlipidemia and Vitamin D Deficiency. Patient also has long hx/o IBS and is apparently controlled on 1 to 2 tabs /daily of his hyoscyamine.      Patient's labile HTN predates since 2011. Patient's BP has been controlled at home.  Today's BP is at goal - 128/84. Patient denies any cardiac symptoms as chest pain, palpitations, shortness of breath, dizziness or ankle swelling.     Patient's hyperlipidemia is controlled with diet.  Patient denies myalgias or other medication SE's. Last lipids were at goal with abnormally elevated Trig's: Lab Results  Component Value Date   CHOL 176 04/07/2016   HDL 34 (L) 04/07/2016   LDLCALC 99 04/07/2016   TRIG 216 (H) 04/07/2016   CHOLHDL 5.2 (H) 04/07/2016      Patient is screened expectantly for prediabetes and patient denies reactive hypoglycemic symptoms, visual blurring, diabetic polys or paresthesias. Last A1c was at goal: Lab Results  Component Value Date   HGBA1C 5.4 04/07/2016       Finally, patient has history of Vitamin D Deficiency ("45" in 2011) and last vitamin D was nearer goal (70-100): Lab Results  Component Value Date   VD25OH 50 04/07/2016   Current Outpatient Prescriptions on File Prior to Visit  Medication Sig  . hyoscyamine (LEVBID) 0.375 MG 12  hr tablet TAKE 1 TABLET TWICE A DAY FOR SPASTIC COLON  . ibuprofen (ADVIL,MOTRIN) 600 MG tablet Take 1 tablet (600 mg total) by mouth every 6 (six) hours as needed.  Marland Kitchen MAGNESIUM PO Take 1 tablet by mouth daily.   Marland Kitchen MILK THISTLE PO Take 1 tablet by mouth daily.   . ondansetron (ZOFRAN ODT) 4 MG disintegrating tablet Take 1 tablet (4 mg total) by mouth every 8 (eight) hours as needed for nausea or vomiting.   No current facility-administered medications on file prior to visit.    No Known Allergies Past Medical History:  Diagnosis Date  . Hx of migraines   . Irritable bowel syndrome   . Labile hypertension   . Mixed hyperlipidemia    Health Maintenance  Topic Date Due  . INFLUENZA VACCINE  05/12/2017  . TETANUS/TDAP  04/11/2021  . HIV Screening  Completed   Immunization History  Administered Date(s) Administered  . PPD Test 03/15/2014, 04/07/2016  . Pneumococcal Polysaccharide-23 02/15/2009  . Tdap 04/12/2011   Past Surgical History:  Procedure Laterality Date  . duodenal surgery     as an infant  . great toe ingrown toenail removal    . WISDOM TOOTH EXTRACTION     Family History  Problem Relation Age of Onset  . Colon cancer Neg Hx   . Irritable bowel syndrome Mother   . Hypertension Mother   .  Hyperlipidemia Mother   . Irritable bowel syndrome Cousin   . Diverticulitis Cousin    Social History   Social History  . Marital status: Single    Spouse name: N/A  . Number of children: N/A  . Years of education: N/A   Occupational History  . 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 Smoking status: Former Smoker    Types: Cigarettes    Quit date: 10/12/2000  . Smokeless tobacco: Never Used  . Alcohol use 1.2 oz/week    2 Cans of beer per week     Comment: 2 beers per week  . Drug use: No  . Sexual activity: Not on file   Other Topics Concern  . Not on file   Social History Narrative  . No narrative on file    ROS Constitutional:  Denies fever, chills, weight loss/gain, headaches, insomnia,  night sweats or change in appetite. Does c/o fatigue. Eyes: Denies redness, blurred vision, diplopia, discharge, itchy or watery eyes.  ENT: Denies discharge, congestion, post nasal drip, epistaxis, sore throat, earache, hearing loss, dental pain, Tinnitus, Vertigo, Sinus pain or snoring.  Cardio: Denies chest pain, palpitations, irregular heartbeat, syncope, dyspnea, diaphoresis, orthopnea, PND, claudication or edema Respiratory: denies cough, dyspnea, DOE, pleurisy, hoarseness, laryngitis or wheezing.  Gastrointestinal: Denies dysphagia, heartburn, reflux, water brash, pain, cramps, nausea, vomiting, bloating, diarrhea, constipation, hematemesis, melena, hematochezia, jaundice or hemorrhoids Genitourinary: Denies dysuria, frequency, urgency, nocturia, hesitancy, discharge, hematuria or flank pain Musculoskeletal: Denies arthralgia, myalgia, stiffness, Jt. Swelling, pain, limp or strain/sprain. Denies Falls. Skin: Denies puritis, rash, hives, warts, acne, eczema or change in skin lesion Neuro: No weakness, tremor, incoordination, spasms, paresthesia or pain Psychiatric: Denies confusion, memory loss or sensory loss. Denies Depression. Endocrine: Denies change in weight, skin, hair change, nocturia, and paresthesia, diabetic polys, visual blurring or hyper / hypo glycemic episodes.  Heme/Lymph: No excessive bleeding, bruising or enlarged lymph nodes.  Physical Exam  BP 128/84   Pulse 88   Temp (!) 97.3 F (36.3 C)   Resp 16   Ht 5' 7.75" (1.721 m)   Wt 197 lb 3.2 oz (89.4 kg)   BMI 30.21 kg/m   General Appearance: Well nourished and well groomed and in no apparent distress.  Eyes: PERRLA, EOMs, conjunctiva no swelling or erythema, normal fundi and vessels. Sinuses: No frontal/maxillary tenderness ENT/Mouth: EACs patent / TMs  nl. Nares clear without erythema, swelling, mucoid exudates. Oral hygiene is good. No erythema,  swelling, or exudate. Tongue normal, non-obstructing. Tonsils not swollen or erythematous. Hearing normal.  Neck: Supple, thyroid normal. No bruits, nodes or JVD. Respiratory: Respiratory effort normal.  BS equal and clear bilateral without rales, rhonci, wheezing or stridor. Cardio: Heart sounds are normal with regular rate and rhythm and no murmurs, rubs or gallops. Peripheral pulses are normal and equal bilaterally without edema. No aortic or femoral bruits. Chest: symmetric with normal excursions and percussion.  Abdomen: Soft, with Nl bowel sounds. Nontender, no guarding, rebound, hernias, masses, or organomegaly.  Lymphatics: Non tender without lymphadenopathy.  Genitourinary: No hernias.Testes nl. DRE - deferred for age. Musculoskeletal: Full ROM all peripheral extremities, joint stability, 5/5 strength, and normal gait. Skin: Warm and dry without rashes, lesions, cyanosis, clubbing or  ecchymosis.  Neuro: Cranial nerves intact, reflexes equal bilaterally. Normal muscle tone, no cerebellar symptoms. Sensation intact.  Pysch: Alert and oriented X 3 with normal affect, insight and judgment appropriate.   Assessment and Plan  1. Annual Preventative/Screening Exam  2. Labile hypertension  - Urinalysis, Routine w reflex microscopic - Microalbumin / creatinine urine ratio - CBC with Differential/Platelet - BASIC METABOLIC PANEL WITH GFR - Magnesium - TSH  3. Mixed hyperlipidemia  - Hepatic function panel - Lipid panel - TSH  4. Abnormal glucose  - Hemoglobin A1c - Insulin, random  5. Vitamin D deficiency  - VITAMIN D 25 Hydroxy  6. Irritable bowel syndrome   7. Screening for rectal cancer  - POC Hemoccult Bld/Stl   8. Screening examination for pulmonary tuberculosis   9. Fatigue, unspecified type  - Vitamin B12 - Iron and TIBC - Testosterone - CBC with Differential/Platelet - Hepatic function panel  10. Medication management  - Urinalysis, Routine w  reflex microscopic - Microalbumin / creatinine urine ratio - CBC with Differential/Platelet - BASIC METABOLIC PANEL WITH GFR - Hepatic function panel - Magnesium - Lipid panel - TSH - Hemoglobin A1c - Insulin, random - VITAMIN D 25 Hydroxy        Patient was counseled in prudent diet, weight control to achieve/maintain BMI less than 25, BP monitoring, regular exercise and medications as discussed.  Discussed med effects and SE's. Routine screening labs and tests as requested with regular follow-up as recommended. Over 40 minutes of exam, counseling, chart review and high complex critical decision making was performed

## 2017-05-02 NOTE — Patient Instructions (Addendum)

## 2017-05-03 ENCOUNTER — Encounter: Payer: Self-pay | Admitting: Internal Medicine

## 2017-05-03 ENCOUNTER — Ambulatory Visit (INDEPENDENT_AMBULATORY_CARE_PROVIDER_SITE_OTHER): Payer: Commercial Managed Care - PPO | Admitting: Internal Medicine

## 2017-05-03 VITALS — BP 128/84 | HR 88 | Temp 97.3°F | Resp 16 | Ht 67.75 in | Wt 197.2 lb

## 2017-05-03 DIAGNOSIS — Z79899 Other long term (current) drug therapy: Secondary | ICD-10-CM

## 2017-05-03 DIAGNOSIS — R0989 Other specified symptoms and signs involving the circulatory and respiratory systems: Secondary | ICD-10-CM

## 2017-05-03 DIAGNOSIS — E559 Vitamin D deficiency, unspecified: Secondary | ICD-10-CM

## 2017-05-03 DIAGNOSIS — Z111 Encounter for screening for respiratory tuberculosis: Secondary | ICD-10-CM | POA: Diagnosis not present

## 2017-05-03 DIAGNOSIS — Z0000 Encounter for general adult medical examination without abnormal findings: Secondary | ICD-10-CM | POA: Diagnosis not present

## 2017-05-03 DIAGNOSIS — Z1212 Encounter for screening for malignant neoplasm of rectum: Secondary | ICD-10-CM

## 2017-05-03 DIAGNOSIS — I10 Essential (primary) hypertension: Secondary | ICD-10-CM

## 2017-05-03 DIAGNOSIS — R7309 Other abnormal glucose: Secondary | ICD-10-CM

## 2017-05-03 DIAGNOSIS — Z136 Encounter for screening for cardiovascular disorders: Secondary | ICD-10-CM | POA: Diagnosis not present

## 2017-05-03 DIAGNOSIS — K589 Irritable bowel syndrome without diarrhea: Secondary | ICD-10-CM

## 2017-05-03 DIAGNOSIS — Z0001 Encounter for general adult medical examination with abnormal findings: Secondary | ICD-10-CM

## 2017-05-03 DIAGNOSIS — R5383 Other fatigue: Secondary | ICD-10-CM

## 2017-05-03 DIAGNOSIS — E782 Mixed hyperlipidemia: Secondary | ICD-10-CM

## 2017-05-03 LAB — URINALYSIS, ROUTINE W REFLEX MICROSCOPIC
Bilirubin Urine: NEGATIVE
Glucose, UA: NEGATIVE
Hgb urine dipstick: NEGATIVE
KETONES UR: NEGATIVE
Leukocytes, UA: NEGATIVE
Nitrite: NEGATIVE
PH: 6 (ref 5.0–8.0)
Protein, ur: NEGATIVE
SPECIFIC GRAVITY, URINE: 1.024 (ref 1.001–1.035)

## 2017-05-03 LAB — BASIC METABOLIC PANEL WITH GFR
BUN: 16 mg/dL (ref 7–25)
CALCIUM: 9.5 mg/dL (ref 8.6–10.3)
CO2: 24 mmol/L (ref 20–31)
CREATININE: 1.01 mg/dL (ref 0.60–1.35)
Chloride: 103 mmol/L (ref 98–110)
GFR, Est African American: >89 mL/min (ref >=60)
GFR, Est Non African American: >89 mL/min (ref >=60)
Glucose, Bld: 119 mg/dL — ABNORMAL HIGH (ref 65–99)
Potassium: 4.4 mmol/L (ref 3.5–5.3)
SODIUM: 136 mmol/L (ref 135–146)

## 2017-05-03 LAB — CBC WITH DIFFERENTIAL/PLATELET
BASOS ABS: 0 {cells}/uL (ref 0–200)
Basophils Relative: 0 %
EOS PCT: 1 %
Eosinophils Absolute: 63 cells/uL (ref 15–500)
HCT: 46.7 % (ref 38.5–50.0)
HEMOGLOBIN: 16 g/dL (ref 13.2–17.1)
LYMPHS ABS: 1638 {cells}/uL (ref 850–3900)
Lymphocytes Relative: 26 %
MCH: 28.8 pg (ref 27.0–33.0)
MCHC: 34.3 g/dL (ref 32.0–36.0)
MCV: 84 fL (ref 80.0–100.0)
MONOS PCT: 10 %
MPV: 13.1 fL — ABNORMAL HIGH (ref 7.5–12.5)
Monocytes Absolute: 630 cells/uL (ref 200–950)
NEUTROS PCT: 63 %
Neutro Abs: 3969 cells/uL (ref 1500–7800)
Platelets: 177 10*3/uL (ref 140–400)
RBC: 5.56 MIL/uL (ref 4.20–5.80)
RDW: 13.1 % (ref 11.0–15.0)
WBC: 6.3 10*3/uL (ref 3.8–10.8)

## 2017-05-03 LAB — HEPATIC FUNCTION PANEL
ALT: 17 U/L (ref 9–46)
AST: 21 U/L (ref 10–40)
Albumin: 4.4 g/dL (ref 3.6–5.1)
Alkaline Phosphatase: 58 U/L (ref 40–115)
BILIRUBIN DIRECT: 0.2 mg/dL (ref <=0.2)
Indirect Bilirubin: 1.1 mg/dL (ref 0.2–1.2)
TOTAL PROTEIN: 7.2 g/dL (ref 6.1–8.1)
Total Bilirubin: 1.3 mg/dL — ABNORMAL HIGH (ref 0.2–1.2)

## 2017-05-03 LAB — LIPID PANEL
CHOL/HDL RATIO: 5.8 ratio — AB (ref <5.0)
CHOLESTEROL: 210 mg/dL — AB (ref <200)
HDL: 36 mg/dL — ABNORMAL LOW (ref >40)
LDL Cholesterol: 131 mg/dL — ABNORMAL HIGH (ref <100)
Triglycerides: 215 mg/dL — ABNORMAL HIGH (ref <150)
VLDL: 43 mg/dL — ABNORMAL HIGH (ref <30)

## 2017-05-03 LAB — TESTOSTERONE: TESTOSTERONE: 481 ng/dL (ref 250–827)

## 2017-05-03 LAB — TSH: TSH: 2.02 m[IU]/L (ref 0.40–4.50)

## 2017-05-03 LAB — MAGNESIUM: MAGNESIUM: 1.7 mg/dL (ref 1.5–2.5)

## 2017-05-03 LAB — IRON AND TIBC
%SAT: 22 % (ref 15–60)
IRON: 99 ug/dL (ref 50–180)
TIBC: 444 ug/dL — ABNORMAL HIGH (ref 250–425)
UIBC: 345 ug/dL

## 2017-05-03 LAB — VITAMIN D 25 HYDROXY (VIT D DEFICIENCY, FRACTURES): VIT D 25 HYDROXY: 66 ng/mL (ref 30–100)

## 2017-05-03 NOTE — Addendum Note (Signed)
Addended by: Valrie HartEVANS, Cosette Prindle C on: 05/03/2017 11:40 AM   Modules accepted: Orders

## 2017-05-04 LAB — MICROALBUMIN / CREATININE URINE RATIO
Creatinine, Urine: 189 mg/dL (ref 20–370)
Microalb Creat Ratio: 13 mcg/mg creat (ref <30)
Microalb, Ur: 2.4 mg/dL

## 2017-05-04 LAB — HEMOGLOBIN A1C
HEMOGLOBIN A1C: 5.2 % (ref <5.7)
MEAN PLASMA GLUCOSE: 103 mg/dL

## 2017-05-04 LAB — INSULIN, RANDOM: INSULIN: 80.4 u[IU]/mL — AB (ref 2.0–19.6)

## 2017-05-04 LAB — VITAMIN B12: Vitamin B-12: 408 pg/mL (ref 200–1100)

## 2017-05-07 ENCOUNTER — Other Ambulatory Visit: Payer: Self-pay | Admitting: *Deleted

## 2017-05-07 MED ORDER — HYOSCYAMINE SULFATE ER 0.375 MG PO TB12: ORAL_TABLET | ORAL | 1 refills | Status: DC

## 2017-05-21 NOTE — Addendum Note (Signed)
Addended by: Lucky CowboyMCKEOWN, Ashante Yellin on: 05/21/2017 05:46 PM   Modules accepted: Orders

## 2018-02-06 ENCOUNTER — Encounter (HOSPITAL_COMMUNITY): Payer: Self-pay | Admitting: Emergency Medicine

## 2018-02-06 ENCOUNTER — Emergency Department (HOSPITAL_COMMUNITY)
Admission: EM | Admit: 2018-02-06 | Discharge: 2018-02-06 | Disposition: A | Discharge status: Home / Self Care | Payer: BLUE CROSS/BLUE SHIELD | Attending: Emergency Medicine | Admitting: Emergency Medicine

## 2018-02-06 ENCOUNTER — Emergency Department (HOSPITAL_COMMUNITY): Payer: BLUE CROSS/BLUE SHIELD

## 2018-02-06 DIAGNOSIS — Z87891 Personal history of nicotine dependence: Secondary | ICD-10-CM | POA: Insufficient documentation

## 2018-02-06 DIAGNOSIS — R1084 Generalized abdominal pain: Secondary | ICD-10-CM | POA: Insufficient documentation

## 2018-02-06 DIAGNOSIS — R0789 Other chest pain: Secondary | ICD-10-CM | POA: Diagnosis not present

## 2018-02-06 DIAGNOSIS — Z20828 Contact with and (suspected) exposure to other viral communicable diseases: Secondary | ICD-10-CM | POA: Insufficient documentation

## 2018-02-06 DIAGNOSIS — R0602 Shortness of breath: Secondary | ICD-10-CM | POA: Insufficient documentation

## 2018-02-06 DIAGNOSIS — M7918 Myalgia, other site: Secondary | ICD-10-CM | POA: Diagnosis not present

## 2018-02-06 DIAGNOSIS — R509 Fever, unspecified: Secondary | ICD-10-CM | POA: Insufficient documentation

## 2018-02-06 DIAGNOSIS — J029 Acute pharyngitis, unspecified: Secondary | ICD-10-CM | POA: Insufficient documentation

## 2018-02-06 DIAGNOSIS — R197 Diarrhea, unspecified: Secondary | ICD-10-CM | POA: Diagnosis not present

## 2018-02-06 LAB — URINALYSIS, ROUTINE W REFLEX MICROSCOPIC
Bacteria, UA: NONE SEEN
Bilirubin Urine: NEGATIVE
Glucose, UA: NEGATIVE mg/dL
Ketones, ur: 20 mg/dL — AB
Leukocytes, UA: NEGATIVE
Nitrite: NEGATIVE
Protein, ur: 100 mg/dL — AB
Specific Gravity, Urine: 1.027 (ref 1.005–1.030)
pH: 5 (ref 5.0–8.0)

## 2018-02-06 LAB — I-STAT CG4 LACTIC ACID, ED: Lactic Acid, Venous: 0.8 mmol/L (ref 0.5–1.9)

## 2018-02-06 LAB — COMPREHENSIVE METABOLIC PANEL
ALT: 22 U/L (ref 17–63)
AST: 27 U/L (ref 15–41)
Albumin: 4.2 g/dL (ref 3.5–5.0)
Alkaline Phosphatase: 63 U/L (ref 38–126)
Anion gap: 12 (ref 5–15)
BUN: 14 mg/dL (ref 6–20)
CO2: 21 mmol/L — ABNORMAL LOW (ref 22–32)
Calcium: 9.5 mg/dL (ref 8.9–10.3)
Chloride: 103 mmol/L (ref 101–111)
Creatinine, Ser: 0.84 mg/dL (ref 0.61–1.24)
GFR calc Af Amer: >60 mL/min (ref >60)
GFR calc non Af Amer: >60 mL/min (ref >60)
Glucose, Bld: 112 mg/dL — ABNORMAL HIGH (ref 65–99)
Potassium: 3.8 mmol/L (ref 3.5–5.1)
Sodium: 136 mmol/L (ref 135–145)
Total Bilirubin: 2.9 mg/dL — ABNORMAL HIGH (ref 0.3–1.2)
Total Protein: 8 g/dL (ref 6.5–8.1)

## 2018-02-06 LAB — INFLUENZA PANEL BY PCR (TYPE A & B)
Influenza A By PCR: NEGATIVE
Influenza B By PCR: NEGATIVE

## 2018-02-06 LAB — CBC WITH DIFFERENTIAL/PLATELET
Basophils Absolute: 0 10*3/uL (ref 0.0–0.1)
Basophils Relative: 0 %
Eosinophils Absolute: 0 10*3/uL (ref 0.0–0.7)
Eosinophils Relative: 0 %
HCT: 43.4 % (ref 39.0–52.0)
Hemoglobin: 14.9 g/dL (ref 13.0–17.0)
Lymphocytes Relative: 7 %
Lymphs Abs: 1.2 10*3/uL (ref 0.7–4.0)
MCH: 29.7 pg (ref 26.0–34.0)
MCHC: 34.3 g/dL (ref 30.0–36.0)
MCV: 86.5 fL (ref 78.0–100.0)
Monocytes Absolute: 1.6 10*3/uL — ABNORMAL HIGH (ref 0.1–1.0)
Monocytes Relative: 10 %
Neutro Abs: 13.3 10*3/uL — ABNORMAL HIGH (ref 1.7–7.7)
Neutrophils Relative %: 83 %
Platelets: 141 10*3/uL — ABNORMAL LOW (ref 150–400)
RBC: 5.02 MIL/uL (ref 4.22–5.81)
RDW: 13.2 % (ref 11.5–15.5)
WBC: 16.1 10*3/uL — ABNORMAL HIGH (ref 4.0–10.5)

## 2018-02-06 LAB — I-STAT TROPONIN, ED: Troponin i, poc: 0 ng/mL (ref 0.00–0.08)

## 2018-02-06 LAB — MONONUCLEOSIS SCREEN: Mono Screen: POSITIVE — AB

## 2018-02-06 LAB — GROUP A STREP BY PCR: Group A Strep by PCR: NOT DETECTED

## 2018-02-06 LAB — LIPASE, BLOOD: Lipase: 35 U/L (ref 11–51)

## 2018-02-06 MED ORDER — DEXAMETHASONE SODIUM PHOSPHATE 10 MG/ML IJ SOLN
10.0000 mg | Freq: Once | INTRAMUSCULAR | Status: AC
  Administered 2018-02-06: 10 mg via INTRAVENOUS
  Filled 2018-02-06: qty 1

## 2018-02-06 MED ORDER — ACETAMINOPHEN 325 MG PO TABS: 650.0000 mg | ORAL_TABLET | Freq: Once | ORAL | Status: DC

## 2018-02-06 MED ORDER — ACETAMINOPHEN 500 MG PO TABS: 500.0000 mg | ORAL_TABLET | Freq: Four times a day (QID) | ORAL | 0 refills | Status: AC | PRN

## 2018-02-06 MED ORDER — SODIUM CHLORIDE 0.9 % IV BOLUS
1000.0000 mL | Freq: Once | INTRAVENOUS | Status: AC
  Administered 2018-02-06: 1000 mL via INTRAVENOUS

## 2018-02-06 NOTE — ED Triage Notes (Signed)
Pt reports he was bit by 2 ticks last weekend and since he has had body aches, fatigue, fever last night and took Nyquil.

## 2018-02-06 NOTE — ED Provider Notes (Signed)
[facility]-EMERGENCY DEPT Provider Note   CSN: [id number] Arrival date & time: 02/06/18  1045     History   Chief Complaint Chief Complaint  Patient presents with  . Generalized Body Aches  . Fever    HPI Dwayne Pittman is a 37 y.o. male with history of IBS, migraines, Gilbert's syndrome, hyperlipidemia presents for evaluation of 1 week of myalgias and 2 days of sore throat, chest pain, shortness of breath, and diarrhea.  Patient states that one week ago he was camping and noted to small ticks, one attached to the left upper extremity and one attached to the suprapubic region of the abdomen.  He was able to remove both successfully and thinks they may have been attached anywhere between 24 to 36 hours.  He notes that he has had generalized myalgias all week but 2 days ago developed a sore throat and pain with swallowing, right worse than left.  He also notes a fever, T-max 101.9 F with improvement with NyQuil.  He notes shortness of breath and constant left-sided chest pain which he describes as a tightness for the past 2 days.  Pain and shortness of breath worsened with exertion, improved with rest.  Pain does not radiate.  He denies nausea or vomiting.  He does endorse a crampy generalized abdominal pain but he states "I think it is hunger pains because I have not been eating much ".  He does endorse 5 episodes of watery nonbloody diarrhea since yesterday.  Denies urinary symptoms, melena, hematochezia.  The history is provided by the patient.    Past Medical History:  Diagnosis Date  . Hx of migraines   . Irritable bowel syndrome   . Labile hypertension   . Mixed hyperlipidemia     Patient Active Problem List   Diagnosis Date Noted  . Abnormal glucose 03/22/2015  . Medication management 03/22/2015  . Vitamin D deficiency 03/15/2014  . Mixed hyperlipidemia   . Labile hypertension   . Hx of migraines   . Irritable bowel syndrome 01/19/2011  . Gilbert's  syndrome 01/19/2011    Past Surgical History:  Procedure Laterality Date  . duodenal surgery     as an infant  . great toe ingrown toenail removal    . WISDOM TOOTH EXTRACTION          Home Medications    Prior to Admission medications   Medication Sig Start Date End Date Taking? Authorizing Provider  Cholecalciferol (VITAMIN D PO) Take 2,000 Units by mouth 2 (two) times daily.   Yes [provider]  hyoscyamine (LEVBID) 0.375 MG 12 hr tablet TAKE 1 TABLET TWICE A DAY FOR SPASTIC COLON 05/07/17  Yes Lucky Cowboy, MD  MAGNESIUM PO Take 1 tablet by mouth daily.    Yes [provider]  acetaminophen (TYLENOL) 500 MG tablet Take 1 tablet (500 mg total) by mouth every 6 (six) hours as needed. 02/06/18   Lacye Mccarn A, PA-C  ibuprofen (ADVIL,MOTRIN) 600 MG tablet Take 1 tablet (600 mg total) by mouth every 6 (six) hours as needed. Patient not taking: Reported on 02/06/2018 11/30/16   Antony Madura, PA-C  ondansetron (ZOFRAN ODT) 4 MG disintegrating tablet Take 1 tablet (4 mg total) by mouth every 8 (eight) hours as needed for nausea or vomiting. Patient not taking: Reported on 02/06/2018 11/30/16   Antony Madura, PA-C    Family History Family History  Problem Relation Age of Onset  . Irritable bowel syndrome Mother   .  Hypertension Mother   . Hyperlipidemia Mother   . Irritable bowel syndrome Cousin   . Diverticulitis Cousin   . Colon cancer Neg Hx     Social History Social History   Tobacco Use  . Smoking status: Former Smoker    Types: Cigarettes    Last attempt to quit: 10/12/2000    Years since quitting: 17.3  . Smokeless tobacco: Never Used  Substance Use Topics  . Alcohol use: Yes    Alcohol/week: 1.2 oz    Types: 2 Cans of beer per week    Comment: 2 beers per week  . Drug use: No     Allergies   Patient has no known allergies.   Review of Systems Review of Systems  Constitutional: Positive for chills, fatigue and fever.  Respiratory:  Positive for shortness of breath.   Cardiovascular: Positive for chest pain.  Gastrointestinal: Positive for abdominal pain and diarrhea. Negative for nausea and vomiting.  Musculoskeletal: Positive for myalgias.  Skin: Negative for rash.  Neurological: Negative for syncope.  Hematological: Positive for adenopathy.  All other systems reviewed and are negative.    Physical Exam Updated Vital Signs BP 119/82   Pulse 89   Temp 98.9 F (37.2 C) (Oral)   Resp 20   SpO2 95%   Physical Exam  Constitutional: He is oriented to person, place, and time. He appears well-developed and well-nourished. No distress.  Resting in bed, appears mildly diaphoretic  HENT:  Head: Normocephalic and atraumatic.  Posterior oropharynx with erythema and bilateral tonsillar hypertrophy, right worse than left.  There are exudates to the right tonsil.  No uvular deviation, trismus, or sublingual abnormalities.  Tolerating secretions without difficulty.  Eyes: Pupils are equal, round, and reactive to light. Conjunctivae and EOM are normal. Right eye exhibits no discharge. Left eye exhibits no discharge.  Neck: Normal range of motion. Neck supple. No JVD present. No tracheal deviation present.  Right anterior cervical lymphadenopathy noted.  There is a 1 x 1.5 cm swollen nodule which is firm, nontender, slightly mobile overlying the left sternocleidomastoid muscle.  Patient states this is been present for 6 months.  Cardiovascular: Normal rate, regular rhythm, normal heart sounds and intact distal pulses.  Pulmonary/Chest: Effort normal and breath sounds normal. No stridor. No respiratory distress. He has no wheezes. He has no rales. He exhibits no tenderness.  Abdominal: Soft. Bowel sounds are normal. He exhibits no distension. There is tenderness. There is no guarding.  Mild epigastric tenderness to palpation, Murphy sign absent, Rovsing's absent, no CVA tenderness.  Pinpoint area of erythema in the suprapubic  region where tick was adhered 1 week ago.  Musculoskeletal: Normal range of motion. He exhibits no edema.  No midline spine TTP, no paraspinal muscle tenderness, no deformity, crepitus, or step-off noted   Lymphadenopathy:    He has cervical adenopathy.  Neurological: He is alert and oriented to person, place, and time.  Skin: Skin is warm. He is diaphoretic. No erythema.  Psychiatric: He has a normal mood and affect. His behavior is normal.  Nursing note and vitals reviewed.    ED Treatments / Results  Labs (all labs ordered are listed, but only abnormal results are displayed) Labs Reviewed  COMPREHENSIVE METABOLIC PANEL - Abnormal; Notable for the following components:      Result Value   CO2 21 (*)    Glucose, Bld 112 (*)    Total Bilirubin 2.9 (*)    All other components within normal limits  CBC WITH DIFFERENTIAL/PLATELET - Abnormal; Notable for the following components:   WBC 16.1 (*)    Platelets 141 (*)    Neutro Abs 13.3 (*)    Monocytes Absolute 1.6 (*)    All other components within normal limits  URINALYSIS, ROUTINE W REFLEX MICROSCOPIC - Abnormal; Notable for the following components:   Hgb urine dipstick SMALL (*)    Ketones, ur 20 (*)    Protein, ur 100 (*)    All other components within normal limits  MONONUCLEOSIS SCREEN - Abnormal; Notable for the following components:   Mono Screen POSITIVE (*)    All other components within normal limits  GROUP A STREP BY PCR  LIPASE, BLOOD  INFLUENZA PANEL BY PCR (TYPE A & B)  I-STAT CG4 LACTIC ACID, ED  I-STAT TROPONIN, ED    EKG None  Radiology Dg Chest 2 View  Result Date: 02/06/2018 CLINICAL DATA:  Fatigue, body aches, and fever. EXAM: CHEST - 2 VIEW COMPARISON:  None. FINDINGS: The heart size and mediastinal contours are within normal limits. Both lungs are clear. The visualized skeletal structures are unremarkable. IMPRESSION: Normal chest x-ray. Electronically Signed   By: Obie Dredge M.D.   On:  02/06/2018 18:00    Procedures Procedures (including critical care time)  Medications Ordered in ED Medications  sodium chloride 0.9 % bolus 1,000 mL (0 mLs Intravenous Stopped 02/06/18 1909)  dexamethasone (DECADRON) injection 10 mg (10 mg Intravenous Given 02/06/18 1736)     Initial Impression / Assessment and Plan / ED Course  I have reviewed the triage vital signs and the nursing notes.  Pertinent labs & imaging results that were available during my care of the patient were reviewed by me and considered in my medical decision making (see chart for details).     Patient with complaint of myalgias, fever, fatigue, and sore throat.  He is afebrile, vital signs are stable.  He is uncomfortable but nontoxic in appearance.  No evidence of peritonsillar abscess, no concern for Ludwig's or spread of infection to soft tissue.  He does have tonsillar hypertrophy and exudates on examination.  No meningeal signs to suggest meningitis.  He was exposed to ticks last week but no evidence of erythema migrans rash.  The tics were small and not engorged and were attached for less than 72 hours.  He does not meet criteria for treatment or prophylaxis for Lyme's disease or [facility] spotted fever with doxycycline.  Chest x-ray reviewed by me shows no acute cardiopulmonary abnormalities no evidence of pneumonia or pleural effusion.  His troponin is negative and pain has been constant for 2 days and does not sound cardiac in etiology.  EKG shows no evidence of ST segment abnormality or arrhythmia or ischemic changes.  UA is not concerning for UTI or nephrolithiasis that is suggestive of dehydration.  Remainder of lab work reviewed by me is significant for a leukocytosis, no anemia, no significant electrolyte abnormalities.  He has an elevated total bilirubin of 2.9 but has a history of Gilbert's syndrome so this is not unexpected.  Strep test and influenza test are negative, however Monospot is positive.  On  reevaluation the patient is resting comfortably.  He is tolerating p.o. fluids without difficulty.  He was given IV fluids and Decadron with improvement in his throat pain.  He was given mono precautions including avoidance of ibuprofen and contact sports.  He does play soccer.  Advised the patient to avoid heavy lifting at work as  well.  No evidence of organomegaly on examination.  He will follow-up with his primary care physician on an outpatient basis.  Discussed strict ED return precautions.  Patient and patient's significant other verbalized understanding of and agreement with plan and patient stable for discharge home at this time.  Final Clinical Impressions(s) / ED Diagnoses   Final diagnoses:  Mono exposure    ED Discharge Orders        Ordered    acetaminophen (TYLENOL) 500 MG tablet  Every 6 hours PRN     02/06/18 2011       Bennye Alm 02/06/18 2018    Bethann Berkshire, MD 02/06/18 2303

## 2018-02-06 NOTE — Discharge Instructions (Signed)
Take 500 to 1000 mg of Tylenol every 6 hours as needed for fever and pain.  Do not take ibuprofen.  Use warm water salt gargles, throat lozenges, warm teas to help with sore throat.  The steroids that you were given today will also circulate in your system for 3 days and help with throat pain.  Drink plenty of fluids and get plenty of rest.  Avoid contact sports for 1 month.  Also avoid heavy lifting.  Follow-up with your primary care physician for reevaluation of your symptoms.  Return to the emergency department if any concerning signs or symptoms develop such as fever greater than 102 F not controlled by Tylenol, persistent vomiting, severe headache or neck stiffness.

## 2018-02-06 NOTE — ED Notes (Signed)
Patient transported to X-ray 

## 2018-02-10 ENCOUNTER — Encounter: Payer: Self-pay | Admitting: Internal Medicine

## 2018-02-10 ENCOUNTER — Ambulatory Visit: Payer: BLUE CROSS/BLUE SHIELD | Admitting: Internal Medicine

## 2018-02-10 VITALS — BP 106/70 | HR 62 | Temp 97.7°F | Ht 67.75 in | Wt 166.0 lb

## 2018-02-10 DIAGNOSIS — R945 Abnormal results of liver function studies: Secondary | ICD-10-CM

## 2018-02-10 DIAGNOSIS — K589 Irritable bowel syndrome without diarrhea: Secondary | ICD-10-CM | POA: Diagnosis not present

## 2018-02-10 DIAGNOSIS — D72829 Elevated white blood cell count, unspecified: Secondary | ICD-10-CM

## 2018-02-10 DIAGNOSIS — J029 Acute pharyngitis, unspecified: Secondary | ICD-10-CM

## 2018-02-10 DIAGNOSIS — R7989 Other specified abnormal findings of blood chemistry: Secondary | ICD-10-CM

## 2018-02-10 LAB — COMPLETE METABOLIC PANEL WITH GFR
AG Ratio: 1.5 (calc) (ref 1.0–2.5)
ALBUMIN MSPROF: 4 g/dL (ref 3.6–5.1)
ALKALINE PHOSPHATASE (APISO): 56 U/L (ref 40–115)
ALT: 33 U/L (ref 9–46)
AST: 20 U/L (ref 10–40)
BILIRUBIN TOTAL: 0.5 mg/dL (ref 0.2–1.2)
BUN / CREAT RATIO: 31 (calc) — AB (ref 6–22)
BUN: 26 mg/dL — AB (ref 7–25)
CHLORIDE: 109 mmol/L (ref 98–110)
CO2: 26 mmol/L (ref 20–32)
CREATININE: 0.83 mg/dL (ref 0.60–1.35)
Calcium: 9.5 mg/dL (ref 8.6–10.3)
GFR, Est African American: 129 mL/min/{1.73_m2} (ref >=60)
GFR, Est Non African American: 112 mL/min/{1.73_m2} (ref >=60)
GLUCOSE: 97 mg/dL (ref 65–99)
Globulin: 2.7 g/dL (calc) (ref 1.9–3.7)
Potassium: 4.7 mmol/L (ref 3.5–5.3)
Sodium: 142 mmol/L (ref 135–146)
Total Protein: 6.7 g/dL (ref 6.1–8.1)

## 2018-02-10 LAB — CBC WITH DIFFERENTIAL/PLATELET
BASOS PCT: 0.7 %
Basophils Absolute: 61 cells/uL (ref 0–200)
EOS PCT: 1.7 %
Eosinophils Absolute: 148 cells/uL (ref 15–500)
HCT: 38.2 % — ABNORMAL LOW (ref 38.5–50.0)
HEMOGLOBIN: 13.1 g/dL — AB (ref 13.2–17.1)
Lymphs Abs: 2410 cells/uL (ref 850–3900)
MCH: 28.9 pg (ref 27.0–33.0)
MCHC: 34.3 g/dL (ref 32.0–36.0)
MCV: 84.1 fL (ref 80.0–100.0)
MONOS PCT: 6.2 %
MPV: 12.7 fL — AB (ref 7.5–12.5)
NEUTROS ABS: 5542 {cells}/uL (ref 1500–7800)
Neutrophils Relative %: 63.7 %
PLATELETS: 206 10*3/uL (ref 140–400)
RBC: 4.54 10*6/uL (ref 4.20–5.80)
RDW: 12.3 % (ref 11.0–15.0)
TOTAL LYMPHOCYTE: 27.7 %
WBC mixed population: 539 cells/uL (ref 200–950)
WBC: 8.7 10*3/uL (ref 3.8–10.8)

## 2018-02-10 MED ORDER — HYOSCYAMINE SULFATE ER 0.375 MG PO TB12: ORAL_TABLET | ORAL | 1 refills | Status: DC

## 2018-02-10 NOTE — Progress Notes (Signed)
   Subjective:    Patient ID: Dwayne Pittman, male    DOB: 10/14/1980, 37 y.o.   MRN: [id number]  HPI     This very nice 37 yo MWM was seen in the ER 4/28 with S/T,  (+) Mono Spot,elev Bili.,  elev WBC, watery diarrhea and given IVF & IV Decadron & discharged to take Tylenol prn. GI sx's have resolved & he feels better & is taking diet w/o difficulty.   Medication Sig  . acetaminophen (TYLENOL) 500 MG tablet Take 1 tablet (500 mg total) by mouth every 6 (six) hours as needed.  . Cholecalciferol (VITAMIN D PO) Take 2,000 Units by mouth 2 (two) times daily.  . hyoscyamine (LEVBID) 0.375 MG 12 hr tablet TAKE 1 TABLET TWICE A DAY FOR SPASTIC COLON  . ibuprofen (ADVIL,MOTRIN) 600 MG tablet Take 1 tablet (600 mg total) by mouth every 6 (six) hours as needed. (Patient not taking: Reported on 02/06/2018)  . MAGNESIUM PO Take 1 tablet by mouth daily.   . ondansetron (ZOFRAN ODT) 4 MG disintegrating tablet Take 1 tablet (4 mg total) by mouth every 8 (eight) hours as needed for nausea or vomiting. (Patient not taking: Reported on 02/06/2018)   No Known Allergies  Review of Systems  10 point systems review negative except as above.    Objective:   Physical Exam  BP 106/70   Pulse 62   Temp 97.7 F (36.5 C)   Ht 5' 7.75" (1.721 m)   Wt 166 lb (75.3 kg)   SpO2 96%   BMI 25.43 kg/m   HEENT - WNL. Neck - supple.  Chest - Clear equal BS. Cor - Nl HS. RRR w/o sig MGR. PP 1(+). No edema. MS- FROM w/o deformities.  Gait Nl. Neuro -  Nl w/o focal abnormalities.    Assessment & Plan:   1. Pharyngitis, unspecified etiology  - Epstein-Barr Virus VCA Antibody Panel - CBC with Differential/Platelet  2. Abnormal LFTs  - CBC with Differential/Platelet - COMPLETE METABOLIC PANEL WITH GFR  3. Leukocytosis, unspecified type  - CBC with Differential/Platelet

## 2018-02-11 LAB — EPSTEIN-BARR VIRUS VCA ANTIBODY PANEL
EBV NA IgG: 336 U/mL — ABNORMAL HIGH
EBV VCA IgM: <36 U/mL

## 2018-03-16 ENCOUNTER — Ambulatory Visit: Payer: BLUE CROSS/BLUE SHIELD | Admitting: Internal Medicine

## 2018-03-16 ENCOUNTER — Encounter: Payer: Self-pay | Admitting: Internal Medicine

## 2018-03-16 VITALS — BP 104/70 | HR 60 | Temp 97.3°F | Resp 16 | Ht 67.75 in | Wt 170.8 lb

## 2018-03-16 DIAGNOSIS — L723 Sebaceous cyst: Secondary | ICD-10-CM | POA: Diagnosis not present

## 2018-03-16 NOTE — Progress Notes (Addendum)
  Subjective:    Patient ID: Dwayne Pittman, male    DOB: 11-May-1981, 37 y.o.   MRN: [id number]  HPI  This very nice 37 yo MWM  With Labile HTN presents with concern re: a palpable mass in his Left mid neck and desires to have it removed. Hypertensive systems review is negative Outpatient Medications Prior to Visit  Medication Sig Dispense Refill  . acetaminophen (TYLENOL) 500 MG tablet Take 1 tablet (500 mg total) by mouth every 6 (six) hours as needed. 60 tablet 0  . Cholecalciferol (VITAMIN D PO) Take 4,000 Units by mouth daily.     . hyoscyamine (LEVBID) 0.375 MG 12 hr tablet TAKE 1 TABLET TWICE A DAY FOR SPASTIC COLON 180 tablet 1  . ibuprofen (ADVIL,MOTRIN) 600 MG tablet Take 1 tablet (600 mg total) by mouth every 6 (six) hours as needed. 30 tablet 0  . MAGNESIUM PO Take 1 tablet by mouth daily.     . milk thistle 175 MG tablet Take 175 mg by mouth daily.    . ondansetron (ZOFRAN ODT) 4 MG disintegrating tablet Take 1 tablet (4 mg total) by mouth every 8 (eight) hours as needed for nausea or vomiting. 10 tablet 0   No facility-administered medications prior to visit.    No Known Allergies Past Medical History:  Diagnosis Date  . Hx of migraines   . Irritable bowel syndrome   . Labile hypertension   . Mixed hyperlipidemia    Past Surgical History:  Procedure Laterality Date  . duodenal surgery     as an infant  . great toe ingrown toenail removal    . WISDOM TOOTH EXTRACTION     Objective:   Physical Exam  BP 104/70   Pulse 60   Temp (!) 97.3 F (36.3 C)   Resp 16   Ht 5' 7.75" (1.721 m)   Wt 170 lb 12.8 oz (77.5 kg)   BMI 26.16 kg/m   HEENT - WNL. Neck - supple.  Chest - Clear equal BS. Cor - Nl HS. RRR w/o sig m. PP 1(+). No edema. MS- FROM w/o deformities.  Gait Nl. Neuro -  Nl w/o focal abnormalities. Skin - There is a sl tender soft mobile 1.5 cm mass in the mid Left anterior neck.   Procedure (CPT:  [id number]) - after informed consent & aseptic alcohol  prep, the area was anesthetized locally with 3 cc of Marcaine 0.5% intradermal and subcutaneously. Then with a #10 scalpel, a 2 cm incision was made in the vertical alignment. Then the obvoious sebaceous cyst was gently & sharply dissected free with the scalpel & scissors. After  Cyst removal, the wound was closed with # 2 vertical mattress sutures of 3-0 Proline and then covered with a 2" x 3" Tegaderm.     Assessment & Plan:   1. Sebaceous cyst  - Tegaderm applied and patient instructed in post-op wound care and to return in 10 days for suture removal.

## 2018-03-16 NOTE — Patient Instructions (Signed)
Epidermal Cyst Removal, Care After Refer to this sheet in the next few weeks. These instructions provide you with information about caring for yourself after your procedure. Your health care provider may also give you more specific instructions. Your treatment has been planned according to current medical practices, but problems sometimes occur. Call your health care provider if you have any problems or questions after your procedure. What can I expect after the procedure? After the procedure, it is common to have:  Soreness in the area where your cyst was removed.  Tightness or itching from your skin sutures.  Follow these instructions at home:  Take medicines only as directed by your health care provider.  If you were prescribed an antibiotic medicine, finish all of it even if you start to feel better.  Use antibiotic ointment as directed by your health care provider. Follow the instructions carefully.  There are many different ways to close and cover an incision, including stitches (sutures), skin glue, and adhesive strips. Follow your health care provider's instructions about: ? Incision care. ? Bandage (dressing) changes and removal. ? Incision closure removal.  Keep the bandage (dressing) dry until your health care provider says that it can be removed. Take sponge baths only. Ask your health care provider when you can start showering or taking a bath.  After your dressing is off, check your incision every day for signs of infection. Watch for: ? Redness, swelling, or pain. ? Fluid, blood, or pus.  You can return to your normal activities. Do not do anything that stretches or puts pressure on your incision.  You can return to your normal diet.  Keep all follow-up visits as directed by your health care provider. This is important. Contact a health care provider if:  You have a fever.  Your incision bleeds.  You have redness, swelling, or pain in the incision area.  You  have fluid, blood, or pus coming from your incision.  Your cyst comes back after surgery. This information is not intended to replace advice given to you by your health care provider. Make sure you discuss any questions you have with your health care provider. Document Released: 10/19/2014 Document Revised: 03/05/2016 Document Reviewed: 06/13/2014 Elsevier Interactive Patient Education  2018 [employer]  

## 2018-03-28 ENCOUNTER — Ambulatory Visit: Payer: BLUE CROSS/BLUE SHIELD | Admitting: Internal Medicine

## 2018-03-28 ENCOUNTER — Encounter: Payer: Self-pay | Admitting: Internal Medicine

## 2018-03-28 VITALS — BP 116/80 | HR 64 | Temp 97.6°F | Resp 16 | Ht 67.75 in | Wt 168.6 lb

## 2018-03-28 DIAGNOSIS — L723 Sebaceous cyst: Secondary | ICD-10-CM

## 2018-03-28 NOTE — Progress Notes (Signed)
Patient ID: Dwayne ManisMichael D Mazzocco, male   DOB: 07-01-1981, 37 y.o.   MRN: [id number]     Patient returns today for suture removal 12 days post excision of a sebaceous cyst of the Lt neck. He has experienced some numbness of the left lower ear lobe which seems to be recovering.     Wound appears well healed w/o signs of infection and #3 mattress sutures are removed.

## 2018-04-01 IMAGING — CT CT ABD-PELV W/ CM
2 of 3 series · 16 of 46 positions shown, 18 images · IV contrast (ISOVUE)
Comparison: None.

CLINICAL DATA: Moderate right-sided abdominal pain

EXAM:
CT ABDOMEN AND PELVIS WITH CONTRAST
TECHNIQUE: Multidetector CT imaging of the abdomen and pelvis was performed
using the standard protocol following bolus administration of
intravenous contrast.
CONTRAST:  100mL [id number] IOPAMIDOL ([id number]) INJECTION 61%

[Series 2: abd/pel with · axial · 0.89mm/px · z∈[-472,-72]mm · 13 of 92 slices shown, 15 images]
[im 6/92  soft-tissue]
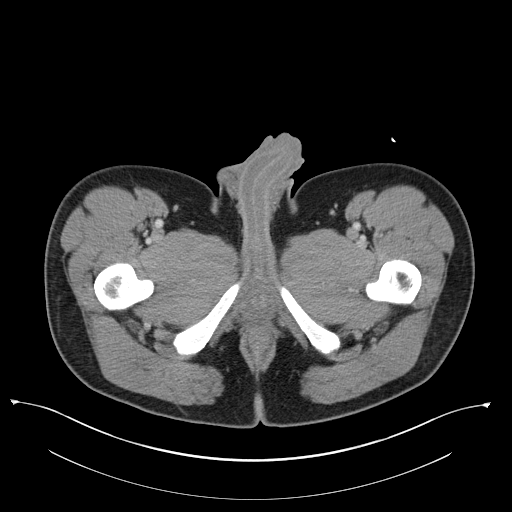
[im 6/92  bone]
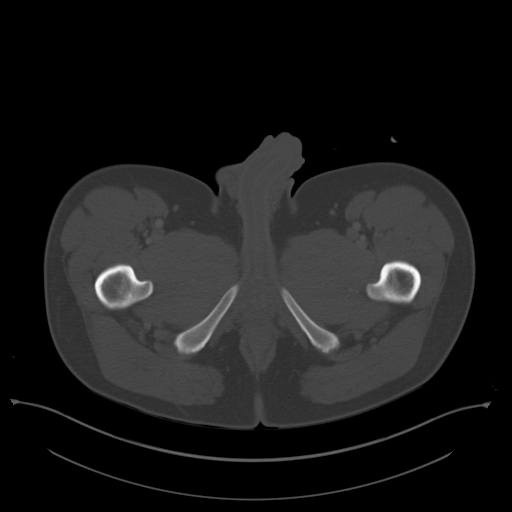
[im 12/92  soft-tissue]
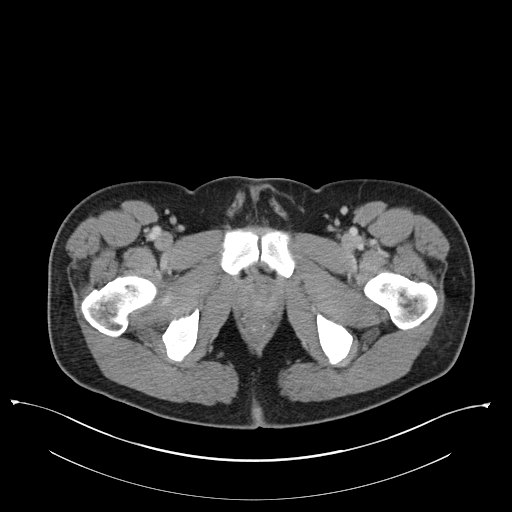
[im 18/92  soft-tissue]
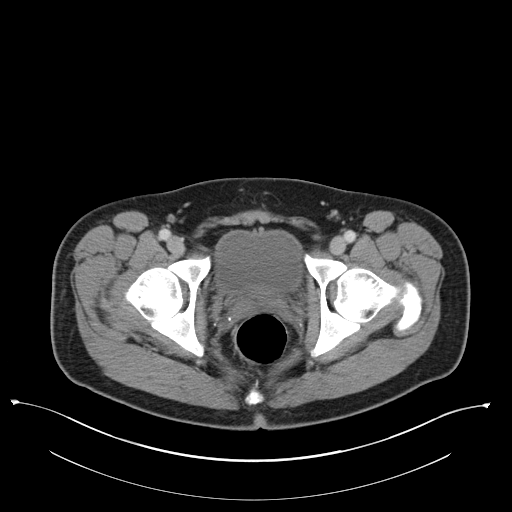
[im 27/92  soft-tissue]
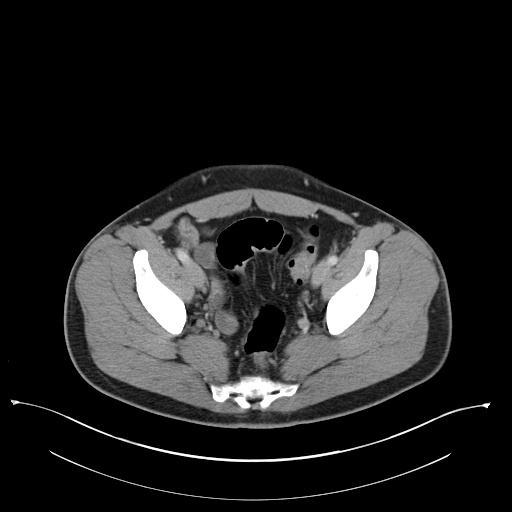
[im 33/92  soft-tissue]
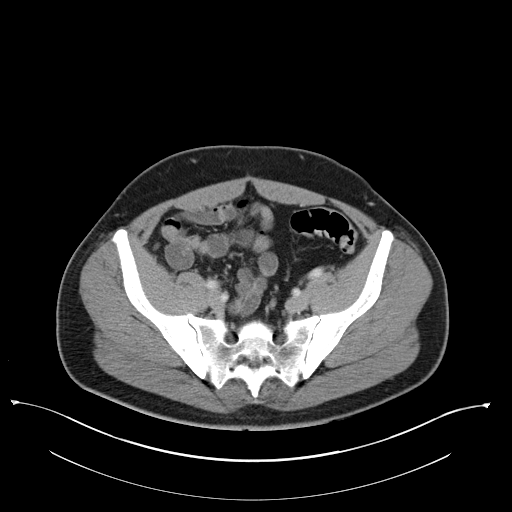
[im 39/92  soft-tissue]
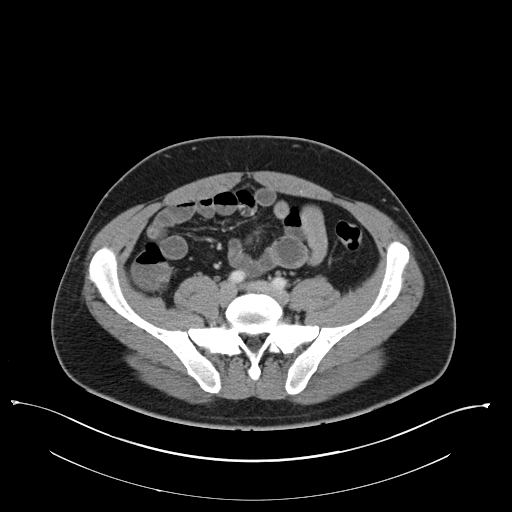
[im 47/92  soft-tissue]
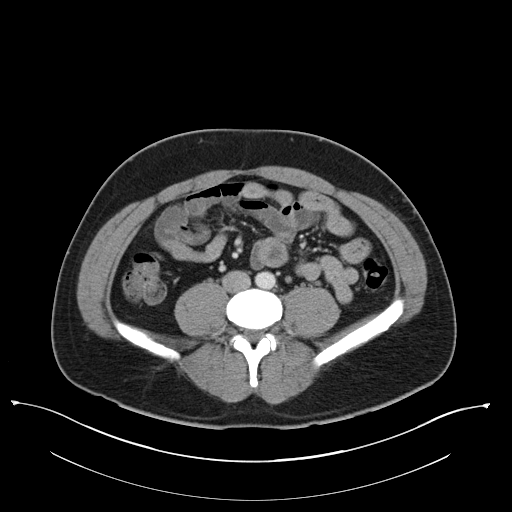
[im 53/92  soft-tissue]
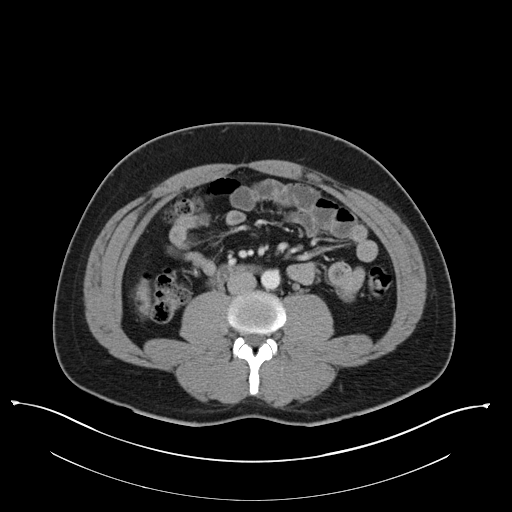
[im 59/92  soft-tissue]
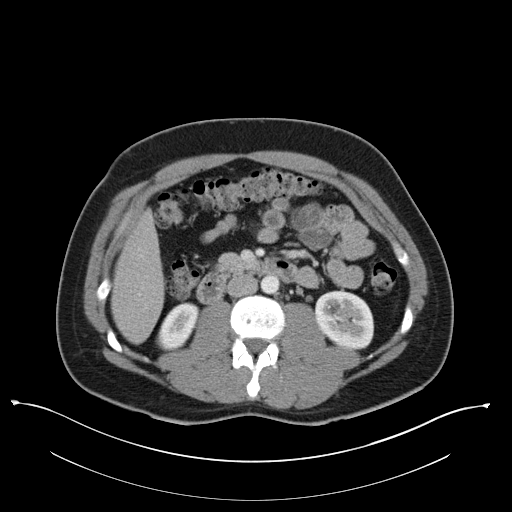
[im 59/92  bone]
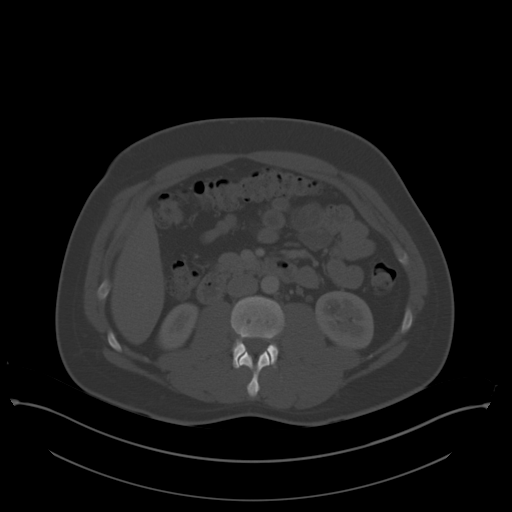
[im 65/92  soft-tissue]
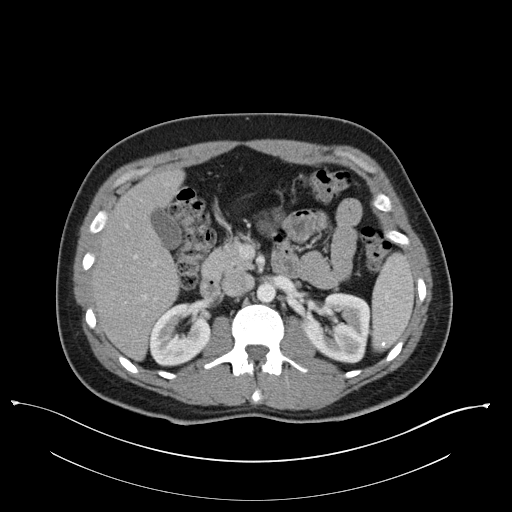
[im 74/92  soft-tissue]
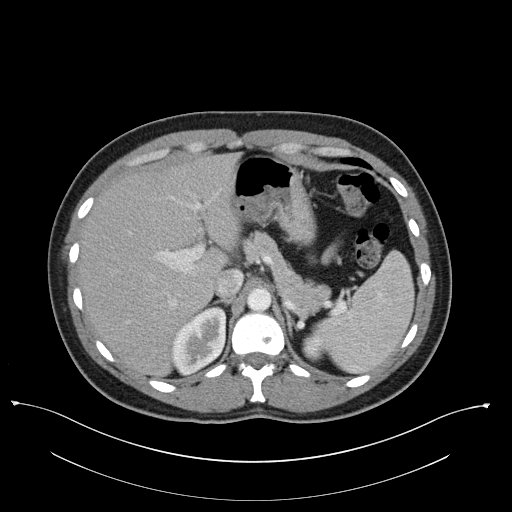
[im 80/92  soft-tissue]
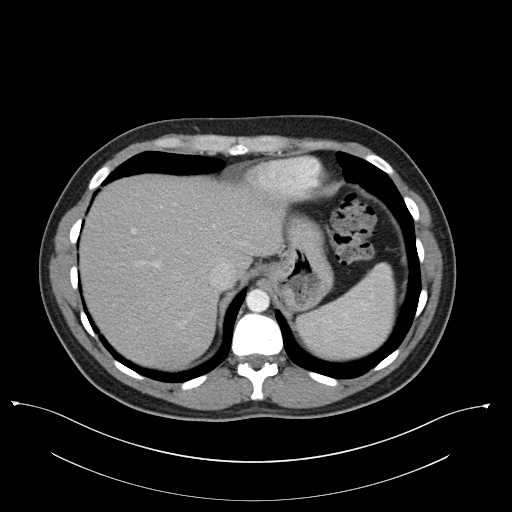
[im 86/92  soft-tissue]
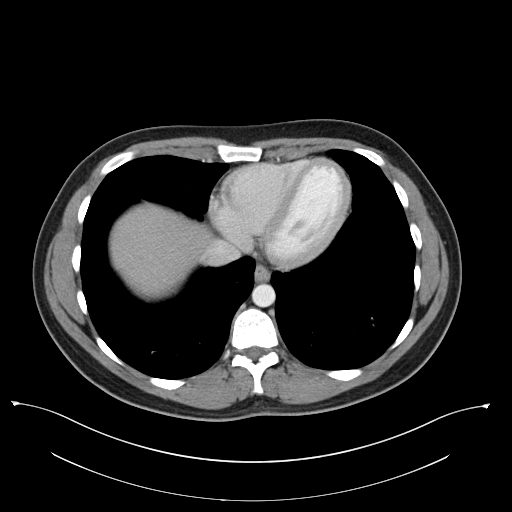

[Series 3: coronal a/|p · coronal · 0.74mm/px · 3 of 159 slices shown]
[im 53/159  soft-tissue]
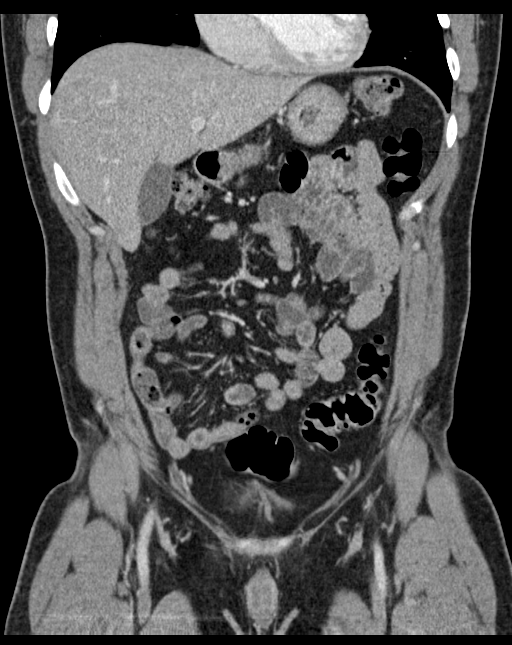
[im 71/159  soft-tissue]
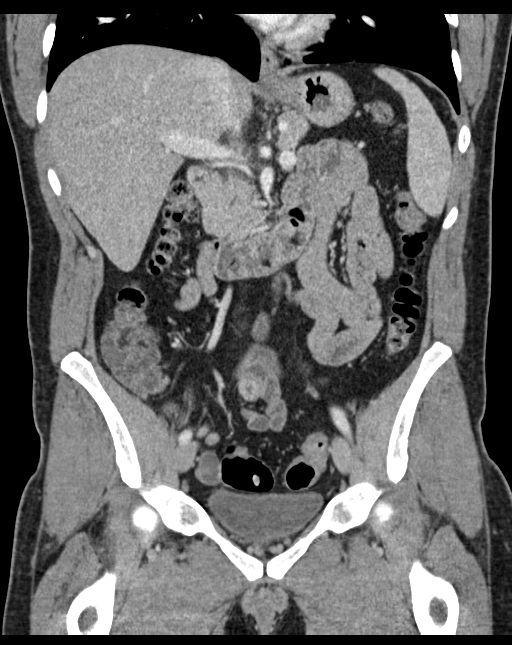
[im 88/159  soft-tissue]
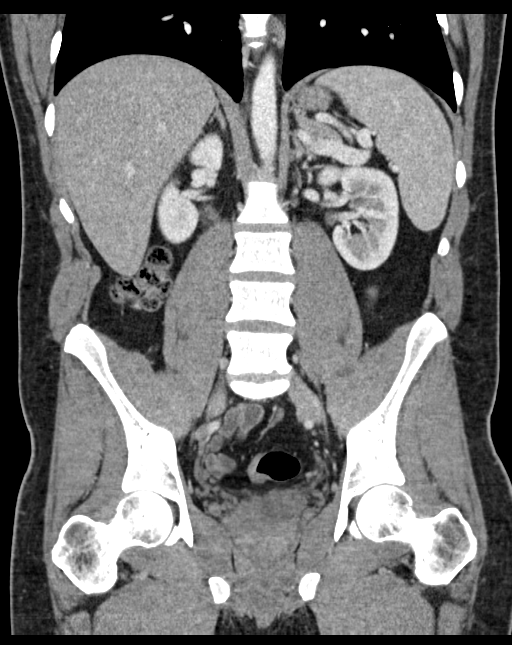

[16 of 46 positions shown; findings below may reference images not displayed]

FINDINGS: Lower chest: No acute abnormality.

Hepatobiliary: No focal liver abnormality is seen. No gallstones,
gallbladder wall thickening, or biliary dilatation.

Pancreas: Unremarkable. No pancreatic ductal dilatation or
surrounding inflammatory changes.

Spleen: Normal in size without focal abnormality. Calcified
granuloma

Adrenals/Urinary Tract: Adrenal glands are unremarkable. Kidneys are
normal, without renal calculi, focal lesion, or hydronephrosis.
Bladder is unremarkable.

Stomach/Bowel: Stomach is within normal limits. Appendix appears
normal. No evidence of bowel wall thickening, distention, or
inflammatory changes.

Vascular/Lymphatic: No significant vascular findings are present. No
enlarged abdominal or pelvic lymph nodes.

Reproductive: Prostate is unremarkable.

Other: Tiny fat containing umbilical hernia. No free air or free
fluid.

Musculoskeletal: No acute or significant osseous findings.
IMPRESSION: No CT evidence for acute intra-abdominal or pelvic pathology.
Negative appendix.

## 2018-04-27 DIAGNOSIS — M7712 Lateral epicondylitis, left elbow: Secondary | ICD-10-CM | POA: Diagnosis not present

## 2018-05-17 ENCOUNTER — Encounter: Payer: Self-pay | Admitting: Internal Medicine

## 2018-06-10 DIAGNOSIS — M7712 Lateral epicondylitis, left elbow: Secondary | ICD-10-CM | POA: Diagnosis not present

## 2018-07-07 ENCOUNTER — Ambulatory Visit: Payer: BLUE CROSS/BLUE SHIELD | Admitting: Internal Medicine

## 2018-07-07 ENCOUNTER — Encounter: Payer: Self-pay | Admitting: Internal Medicine

## 2018-07-07 VITALS — BP 112/74 | HR 60 | Temp 97.3°F | Resp 16 | Ht 67.75 in | Wt 168.2 lb

## 2018-07-07 DIAGNOSIS — R0989 Other specified symptoms and signs involving the circulatory and respiratory systems: Secondary | ICD-10-CM | POA: Diagnosis not present

## 2018-07-07 DIAGNOSIS — Z13220 Encounter for screening for lipoid disorders: Secondary | ICD-10-CM

## 2018-07-07 DIAGNOSIS — Z130 Encounter for screening for diseases of the blood and blood-forming organs and certain disorders involving the immune mechanism: Secondary | ICD-10-CM | POA: Diagnosis not present

## 2018-07-07 DIAGNOSIS — Z1389 Encounter for screening for other disorder: Secondary | ICD-10-CM

## 2018-07-07 DIAGNOSIS — Z1211 Encounter for screening for malignant neoplasm of colon: Secondary | ICD-10-CM

## 2018-07-07 DIAGNOSIS — Z8249 Family history of ischemic heart disease and other diseases of the circulatory system: Secondary | ICD-10-CM

## 2018-07-07 DIAGNOSIS — Z131 Encounter for screening for diabetes mellitus: Secondary | ICD-10-CM | POA: Diagnosis not present

## 2018-07-07 DIAGNOSIS — K589 Irritable bowel syndrome without diarrhea: Secondary | ICD-10-CM

## 2018-07-07 DIAGNOSIS — Z1329 Encounter for screening for other suspected endocrine disorder: Secondary | ICD-10-CM | POA: Diagnosis not present

## 2018-07-07 DIAGNOSIS — Z136 Encounter for screening for cardiovascular disorders: Secondary | ICD-10-CM | POA: Diagnosis not present

## 2018-07-07 DIAGNOSIS — Z111 Encounter for screening for respiratory tuberculosis: Secondary | ICD-10-CM

## 2018-07-07 DIAGNOSIS — R7309 Other abnormal glucose: Secondary | ICD-10-CM

## 2018-07-07 DIAGNOSIS — R5383 Other fatigue: Secondary | ICD-10-CM

## 2018-07-07 DIAGNOSIS — Z79899 Other long term (current) drug therapy: Secondary | ICD-10-CM | POA: Diagnosis not present

## 2018-07-07 DIAGNOSIS — E559 Vitamin D deficiency, unspecified: Secondary | ICD-10-CM

## 2018-07-07 DIAGNOSIS — E782 Mixed hyperlipidemia: Secondary | ICD-10-CM

## 2018-07-07 DIAGNOSIS — Z23 Encounter for immunization: Secondary | ICD-10-CM | POA: Diagnosis not present

## 2018-07-07 DIAGNOSIS — Z87891 Personal history of nicotine dependence: Secondary | ICD-10-CM | POA: Insufficient documentation

## 2018-07-07 DIAGNOSIS — Z0000 Encounter for general adult medical examination without abnormal findings: Secondary | ICD-10-CM

## 2018-07-07 DIAGNOSIS — Z0001 Encounter for general adult medical examination with abnormal findings: Secondary | ICD-10-CM

## 2018-07-07 DIAGNOSIS — Z1212 Encounter for screening for malignant neoplasm of rectum: Secondary | ICD-10-CM

## 2018-07-07 NOTE — Progress Notes (Signed)
[facility]   Lucky Cowboy, M.D.     Dyanne Carrel. Steffanie Dunn, P.A.-C Judd Gaudier, DNP [facility]                [address]                [place], [place]. [address] Telephone [phone] Telefax [phone] Annual  Screening/Preventative Visit  & Comprehensive Evaluation & Examination     This very nice 37 y.o. MWM presents for a Screening /Preventative Visit & comprehensive evaluation and management of multiple medical co-morbidities.  Patient has been followed expectantly for HTN, HLD, Prediabetes and Vitamin D Deficiency. Patient does have antecedent hx/o IBS controlled well on Hyoscyamine.     Patient has been followed expectantly for labile HTN circa 2011. Patient's BP has been controlled at home.  Today's BP is at goal - 112/74. Patient denies any cardiac symptoms as chest pain, palpitations, shortness of breath, dizziness or ankle swelling.                                                                                                                                                     Patient's hyperlipidemia is not controlled with diet. Current lipids are not at goal: Lab Results  Component Value Date   CHOL 231 (H) 07/07/2018   HDL 56 07/07/2018   LDLCALC 158 (H) 07/07/2018   TRIG 72 07/07/2018   CHOLHDL 4.1 07/07/2018      Patient is followed expectantly for prediabetes  and patient denies reactive hypoglycemic symptoms, visual blurring, diabetic polys or paresthesias. Current A1c is Normal: Lab Results  Component Value Date   HGBA1C 5.4 07/07/2018       Finally, patient has history of Vitamin D Deficiency ("45"/2011)  and current vitamin D is at goal:  Lab Results  Component Value Date   VD25OH 72 07/07/2018   Current Outpatient Medications on File Prior to Visit  Medication Sig  . acetaminophen (TYLENOL) 500 MG tablet Take 1 tablet (500 mg total) by mouth every 6 (six) hours as needed.  .  Cholecalciferol (VITAMIN D PO) Take 4,000 Units by mouth daily.   Marland Kitchen ibuprofen (ADVIL,MOTRIN) 600 MG tablet Take 1 tablet (600 mg total) by mouth every 6 (six) hours as needed.  Marland Kitchen MAGNESIUM PO Take 1 tablet by mouth daily.   . milk thistle 175 MG tablet Take 175 mg by mouth daily.  . hyoscyamine (LEVBID) 0.375 MG 12 hr tablet TAKE 1 TABLET TWICE A DAY FOR SPASTIC COLON (Patient not taking: Reported on 07/07/2018)   No current facility-administered medications on file prior to visit.    No Known Allergies Past Medical History:  Diagnosis Date  . Hx of migraines   . Irritable bowel syndrome   . Labile hypertension   . Mixed  hyperlipidemia    Health Maintenance  Topic Date Due  . TETANUS/TDAP  04/11/2021  . INFLUENZA VACCINE  Completed  . HIV Screening  Completed   Immunization History  Administered Date(s) Administered  . Influenza Inj Mdck Quad With Preservative 07/07/2018  . PPD Test 03/15/2014, 04/07/2016, 05/03/2017, 07/07/2018  . Pneumococcal Polysaccharide-23 02/15/2009  . Tdap 04/12/2011   Last Colon -  Past Surgical History:  Procedure Laterality Date  . duodenal surgery     as an infant  . great toe ingrown toenail removal    . WISDOM TOOTH EXTRACTION     Family History  Problem Relation Age of Onset  . Irritable bowel syndrome Mother   . Hypertension Mother   . Hyperlipidemia Mother   . Irritable bowel syndrome Cousin   . Diverticulitis Cousin   . Colon cancer Neg Hx    Social History   Socioeconomic History  . Marital status: Single    Spouse name: Not on file  . Number of children: Not on file  . Years of education: Not on file  . Highest education level: Not on file  Occupational History  . Occupation: Architect: [employer]  Tobacco Use  . Smoking status: Former Smoker    Types: Cigarettes    Last attempt to quit: 10/12/2000    Years since quitting: 17.7  . Smokeless tobacco: Never Used  Substance and Sexual Activity  . Alcohol  use: Yes    Alcohol/week: 2.0 standard drinks    Types: 2 Cans of beer per week    Comment: 2 beers per week  . Drug use: No  . Sexual activity: Not on file    ROS Constitutional: Denies fever, chills, weight loss/gain, headaches, insomnia,  night sweats or change in appetite. Does c/o fatigue. Eyes: Denies redness, blurred vision, diplopia, discharge, itchy or watery eyes.  ENT: Denies discharge, congestion, post nasal drip, epistaxis, sore throat, earache, hearing loss, dental pain, Tinnitus, Vertigo, Sinus pain or snoring.  Cardio: Denies chest pain, palpitations, irregular heartbeat, syncope, dyspnea, diaphoresis, orthopnea, PND, claudication or edema Respiratory: denies cough, dyspnea, DOE, pleurisy, hoarseness, laryngitis or wheezing.  Gastrointestinal: Denies dysphagia, heartburn, reflux, water brash, pain, cramps, nausea, vomiting, bloating, diarrhea, constipation, hematemesis, melena, hematochezia, jaundice or hemorrhoids Genitourinary: Denies dysuria, frequency, urgency, nocturia, hesitancy, discharge, hematuria or flank pain Musculoskeletal: Denies arthralgia, myalgia, stiffness, Jt. Swelling, pain, limp or strain/sprain. Denies Falls. Skin: Denies puritis, rash, hives, warts, acne, eczema or change in skin lesion Neuro: No weakness, tremor, incoordination, spasms, paresthesia or pain Psychiatric: Denies confusion, memory loss or sensory loss. Denies Depression. Endocrine: Denies change in weight, skin, hair change, nocturia, and paresthesia, diabetic polys, visual blurring or hyper / hypo glycemic episodes.  Heme/Lymph: No excessive bleeding, bruising or enlarged lymph nodes.  Physical Exam  BP 112/74   Pulse 60   Temp (!) 97.3 F (36.3 C)   Resp 16   Ht 5' 7.75" (1.721 m)   Wt 168 lb 3.2 oz (76.3 kg)   BMI 25.76 kg/m   General Appearance: Well nourished and well groomed and in no apparent distress.  Eyes: PERRLA, EOMs, conjunctiva no swelling or erythema, normal fundi  and vessels. Sinuses: No frontal/maxillary tenderness ENT/Mouth: EACs patent / TMs  nl. Nares clear without erythema, swelling, mucoid exudates. Oral hygiene is good. No erythema, swelling, or exudate. Tongue normal, non-obstructing. Tonsils not swollen or erythematous. Hearing normal.  Neck: Supple, thyroid not palpable. No bruits, nodes or JVD. Respiratory: Respiratory effort  normal.  BS equal and clear bilateral without rales, rhonci, wheezing or stridor. Cardio: Heart sounds are normal with regular rate and rhythm and no murmurs, rubs or gallops. Peripheral pulses are normal and equal bilaterally without edema. No aortic or femoral bruits. Chest: symmetric with normal excursions and percussion.  Abdomen: Soft, with Nl bowel sounds. Nontender, no guarding, rebound, hernias, masses, or organomegaly.  Lymphatics: Non tender without lymphadenopathy.  Genitourinary: DRE - deferred Musculoskeletal: Full ROM all peripheral extremities, joint stability, 5/5 strength, and normal gait. Skin: Warm and dry without rashes, lesions, cyanosis, clubbing or  ecchymosis.  Neuro: Cranial nerves intact, reflexes equal bilaterally. Normal muscle tone, no cerebellar symptoms. Sensation intact.  Pysch: Alert and oriented X 3 with normal affect, insight and judgment appropriate.   Assessment and Plan  1. Annual Preventative/Screening Exam   2. Labile hypertension  - EKG 12-Lead - Urinalysis, Routine w reflex microscopic - Microalbumin / creatinine urine ratio - CBC with Differential/Platelet - COMPLETE METABOLIC PANEL WITH GFR - Magnesium - TSH  3. Hyperlipidemia, mixed  - EKG 12-Lead - Lipid panel - TSH  4. Abnormal glucose  - EKG 12-Lead - Insulin, random - Hemoglobin A1c  5. Vitamin D deficiency  - VITAMIN D 25 Hydroxyl  6. Irritable bowel syndrome   7. Screening for colorectal cancer  - POC Hemoccult Bld/Stl  8. Screening for ischemic heart disease  - EKG 12-Lead  9. Former  smoker  - EKG 12-Lead  10. FH: hypertension  - EKG 12-Lead  11. Screening examination for pulmonary tuberculosis  - PPD  12. Fatigue, unspecified type  - Iron,Total/Total Iron Binding Cap - Vitamin B12 - Testosterone - CBC with Differential/Platelet  13. Medication management  - Urinalysis, Routine w reflex microscopic - Microalbumin / creatinine urine ratio - CBC with Differential/Platelet - COMPLETE METABOLIC PANEL WITH GFR - Magnesium - Lipid panel - TSH - Insulin, random - VITAMIN D 25 Hydroxyl - Hemoglobin A1c  14. Need for prophylactic vaccination and inoculation against influenza  - FLU VACCINE MDCK QUAD W/Preservative           Patient was counseled in prudent diet, weight control to achieve/maintain BMI less than 25, BP monitoring, regular exercise and medications as discussed.  Discussed med effects and SE's. Routine screening labs and tests as requested with regular follow-up as recommended. Over 40 minutes of exam, counseling, chart review and high complex critical decision making was performed

## 2018-07-07 NOTE — Patient Instructions (Signed)

## 2018-07-08 ENCOUNTER — Other Ambulatory Visit: Payer: Self-pay | Admitting: Internal Medicine

## 2018-07-08 DIAGNOSIS — E782 Mixed hyperlipidemia: Secondary | ICD-10-CM

## 2018-07-08 LAB — VITAMIN D 25 HYDROXY (VIT D DEFICIENCY, FRACTURES): VIT D 25 HYDROXY: 72 ng/mL (ref 30–100)

## 2018-07-08 LAB — URINALYSIS, ROUTINE W REFLEX MICROSCOPIC
Bilirubin Urine: NEGATIVE
GLUCOSE, UA: NEGATIVE
HGB URINE DIPSTICK: NEGATIVE
Ketones, ur: NEGATIVE
LEUKOCYTES UA: NEGATIVE
Nitrite: NEGATIVE
PH: 5.5 (ref 5.0–8.0)
Protein, ur: NEGATIVE
Specific Gravity, Urine: 1.009 (ref 1.001–1.03)

## 2018-07-08 LAB — COMPLETE METABOLIC PANEL WITH GFR
AG RATIO: 1.9 (calc) (ref 1.0–2.5)
ALBUMIN MSPROF: 4.6 g/dL (ref 3.6–5.1)
ALT: 14 U/L (ref 9–46)
AST: 20 U/L (ref 10–40)
Alkaline phosphatase (APISO): 64 U/L (ref 40–115)
BILIRUBIN TOTAL: 1.2 mg/dL (ref 0.2–1.2)
BUN: 24 mg/dL (ref 7–25)
CALCIUM: 10 mg/dL (ref 8.6–10.3)
CHLORIDE: 102 mmol/L (ref 98–110)
CO2: 28 mmol/L (ref 20–32)
Creat: 0.87 mg/dL (ref 0.60–1.35)
GFR, Est African American: 126 mL/min/{1.73_m2} (ref >=60)
GFR, Est Non African American: 109 mL/min/{1.73_m2} (ref >=60)
GLOBULIN: 2.4 g/dL (ref 1.9–3.7)
Glucose, Bld: 91 mg/dL (ref 65–99)
POTASSIUM: 4.7 mmol/L (ref 3.5–5.3)
Sodium: 137 mmol/L (ref 135–146)
TOTAL PROTEIN: 7 g/dL (ref 6.1–8.1)

## 2018-07-08 LAB — TESTOSTERONE: Testosterone: 502 ng/dL (ref 250–827)

## 2018-07-08 LAB — CBC WITH DIFFERENTIAL/PLATELET
BASOS PCT: 0.3 %
Basophils Absolute: 19 cells/uL (ref 0–200)
Eosinophils Absolute: 81 cells/uL (ref 15–500)
Eosinophils Relative: 1.3 %
HEMATOCRIT: 43.7 % (ref 38.5–50.0)
HEMOGLOBIN: 14.8 g/dL (ref 13.2–17.1)
LYMPHS ABS: 1345 {cells}/uL (ref 850–3900)
MCH: 28.1 pg (ref 27.0–33.0)
MCHC: 33.9 g/dL (ref 32.0–36.0)
MCV: 82.9 fL (ref 80.0–100.0)
MPV: 13.4 fL — AB (ref 7.5–12.5)
Monocytes Relative: 8.6 %
NEUTROS PCT: 68.1 %
Neutro Abs: 4222 cells/uL (ref 1500–7800)
Platelets: 184 10*3/uL (ref 140–400)
RBC: 5.27 10*6/uL (ref 4.20–5.80)
RDW: 13.3 % (ref 11.0–15.0)
Total Lymphocyte: 21.7 %
WBC: 6.2 10*3/uL (ref 3.8–10.8)
WBCMIX: 533 {cells}/uL (ref 200–950)

## 2018-07-08 LAB — IRON, TOTAL/TOTAL IRON BINDING CAP
%SAT: 17 % — AB (ref 20–48)
Iron: 66 ug/dL (ref 50–180)
TIBC: 388 mcg/dL (calc) (ref 250–425)

## 2018-07-08 LAB — MICROALBUMIN / CREATININE URINE RATIO
Creatinine, Urine: 23 mg/dL (ref 20–320)
MICROALB/CREAT RATIO: 9 ug/mg{creat} (ref <30)
Microalb, Ur: 0.2 mg/dL

## 2018-07-08 LAB — TSH: TSH: 1.88 m[IU]/L (ref 0.40–4.50)

## 2018-07-08 LAB — LIPID PANEL
CHOL/HDL RATIO: 4.1 (calc) (ref <5.0)
Cholesterol: 231 mg/dL — ABNORMAL HIGH (ref <200)
HDL: 56 mg/dL (ref >40)
LDL Cholesterol (Calc): 158 mg/dL (calc) — ABNORMAL HIGH
Non-HDL Cholesterol (Calc): 175 mg/dL (calc) — ABNORMAL HIGH (ref <130)
Triglycerides: 72 mg/dL (ref <150)

## 2018-07-08 LAB — HEMOGLOBIN A1C
HEMOGLOBIN A1C: 5.4 %{Hb} (ref <5.7)
Mean Plasma Glucose: 108 (calc)
eAG (mmol/L): 6 (calc)

## 2018-07-08 LAB — INSULIN, RANDOM: Insulin: 6.2 u[IU]/mL (ref 2.0–19.6)

## 2018-07-08 LAB — VITAMIN B12: VITAMIN B 12: 715 pg/mL (ref 200–1100)

## 2018-07-08 LAB — MAGNESIUM: Magnesium: 1.8 mg/dL (ref 1.5–2.5)

## 2018-07-08 MED ORDER — ROSUVASTATIN CALCIUM 40 MG PO TABS: ORAL_TABLET | ORAL | 1 refills | Status: DC

## 2018-07-10 ENCOUNTER — Encounter: Payer: Self-pay | Admitting: Internal Medicine

## 2018-08-04 ENCOUNTER — Other Ambulatory Visit: Payer: Self-pay | Admitting: Internal Medicine

## 2018-08-04 DIAGNOSIS — K589 Irritable bowel syndrome without diarrhea: Secondary | ICD-10-CM

## 2018-08-04 MED ORDER — HYOSCYAMINE SULFATE ER 0.375 MG PO TB12: ORAL_TABLET | ORAL | 1 refills | Status: DC

## 2018-08-17 DIAGNOSIS — J0300 Acute streptococcal tonsillitis, unspecified: Secondary | ICD-10-CM | POA: Diagnosis not present

## 2018-12-22 ENCOUNTER — Other Ambulatory Visit: Payer: Self-pay | Admitting: *Deleted

## 2018-12-22 DIAGNOSIS — K589 Irritable bowel syndrome without diarrhea: Secondary | ICD-10-CM

## 2018-12-22 MED ORDER — HYOSCYAMINE SULFATE ER 0.375 MG PO TB12: ORAL_TABLET | ORAL | 1 refills | Status: DC

## 2019-08-09 ENCOUNTER — Encounter: Payer: Self-pay | Admitting: Internal Medicine

## 2019-08-09 NOTE — Patient Instructions (Signed)

## 2019-08-09 NOTE — Progress Notes (Signed)
Annual  Screening/Preventative Visit  & Comprehensive Evaluation & Examination     This very nice 38 y.o. MWM presents for a Screening /Preventative Visit & comprehensive evaluation and management of multiple medical co-morbidities.  Patient has been followed for HTN, HLD, Prediabetes and Vitamin D Deficiency.     Patient has been followed for labile HTN  since 2011. Patient's BP has been controlled and today's BP is at goal - 116/72. Patient denies any cardiac symptoms as chest pain, palpitations, shortness of breath, dizziness or ankle swelling.     Patient's hyperlipidemia is not controlled with diet. Last lipids were not at goal:  Lab Results  Component Value Date   CHOL 231 (H) 07/07/2018   HDL 56 07/07/2018   LDLCALC 158 (H) 07/07/2018   TRIG 72 07/07/2018   CHOLHDL 4.1 07/07/2018      Patient has  Been followed expectantly for glucose intolerance  and patient denies reactive hypoglycemic symptoms, visual blurring, diabetic polys or paresthesias. Last A1c was Normal & at goal:  Lab Results  Component Value Date   HGBA1C 5.4 07/07/2018       Finally, patient has history of Vitamin D Deficiency  ("45" / 2011) and last vitamin D was at goal:  Lab Results  Component Value Date   VD25OH 72 07/07/2018   Current Outpatient Medications on File Prior to Visit  Medication Sig  . acetaminophen (TYLENOL) 500 MG tablet Take 1 tablet (500 mg total) by mouth every 6 (six) hours as needed.  . Cholecalciferol (VITAMIN D PO) Take 4,000 Units by mouth daily.    No current facility-administered medications on file prior to visit.    No Known Allergies   Past Medical History:  Diagnosis Date  . Hx of migraines   . Irritable bowel syndrome   . Labile hypertension   . Mixed hyperlipidemia    Health Maintenance  Topic Date Due  . INFLUENZA VACCINE  05/13/2019  . TETANUS/TDAP  04/11/2021  . HIV Screening  Completed   Immunization History  Administered Date(s) Administered  .  Influenza Inj Mdck Quad With Preservative 07/07/2018  . PPD Test 03/15/2014, 04/07/2016, 05/03/2017, 07/07/2018, 08/10/2019  . Pneumococcal Polysaccharide-23 02/15/2009  . Tdap 04/12/2011   -01/22/2011 - Colonoscopy - Dr Arlyce DiceKaplan - Int Hemorrhoids  Past Surgical History:  Procedure Laterality Date  . duodenal surgery     as an infant  . great toe ingrown toenail removal    . WISDOM TOOTH EXTRACTION     Family History  Problem Relation Age of Onset  . Irritable bowel syndrome Mother   . Hypertension Mother   . Hyperlipidemia Mother   . Irritable bowel syndrome Cousin   . Diverticulitis Cousin   . Colon cancer Neg Hx    Social History   Socioeconomic History  . Marital status: Married     Spouse name: Mertie MooresSuzie Kim (significant other)   Occupational History  . Occupation: Architectelectrician    Employer: [employer]  Tobacco Use  . Smoking status: Former Smoker    Types: Cigarettes    Quit date: 10/12/2000    Years since quitting: 18.8  . Smokeless tobacco: Never Used  Substance and Sexual Activity  . Alcohol use: Yes    Alcohol/week: 2.0 standard drinks    Types: 2 Cans of beer per week    Comment: 2 beers per week  . Drug use: No  . Sexual activity: Not on file    ROS Constitutional: Denies fever, chills, weight  loss/gain, headaches, insomnia,  night sweats or change in appetite. Does c/o fatigue. Eyes: Denies redness, blurred vision, diplopia, discharge, itchy or watery eyes.  ENT: Denies discharge, congestion, post nasal drip, epistaxis, sore throat, earache, hearing loss, dental pain, Tinnitus, Vertigo, Sinus pain or snoring.  Cardio: Denies chest pain, palpitations, irregular heartbeat, syncope, dyspnea, diaphoresis, orthopnea, PND, claudication or edema Respiratory: denies cough, dyspnea, DOE, pleurisy, hoarseness, laryngitis or wheezing.  Gastrointestinal: Denies dysphagia, heartburn, reflux, water brash, pain, cramps, nausea, vomiting, bloating, diarrhea,  constipation, hematemesis, melena, hematochezia, jaundice or hemorrhoids Genitourinary: Denies dysuria, frequency, urgency, nocturia, hesitancy, discharge, hematuria or flank pain Musculoskeletal: Denies arthralgia, myalgia, stiffness, Jt. Swelling, pain, limp or strain/sprain. Denies Falls. Skin: Denies puritis, rash, hives, warts, acne, eczema or change in skin lesion Neuro: No weakness, tremor, incoordination, spasms, paresthesia or pain Psychiatric: Denies confusion, memory loss or sensory loss. Denies Depression. Endocrine: Denies change in weight, skin, hair change, nocturia, and paresthesia, diabetic polys, visual blurring or hyper / hypo glycemic episodes.  Heme/Lymph: No excessive bleeding, bruising or enlarged lymph nodes.  Physical Exam  BP 116/72   Pulse 88   Temp (!) 97.2 F (36.2 C)   Resp 16   Ht 5' 8.5" (1.74 m)   Wt 166 lb (75.3 kg)   BMI 24.87 kg/m   General Appearance: Well nourished and well groomed and in no apparent distress.  Eyes: PERRLA, EOMs, conjunctiva no swelling or erythema, normal fundi and vessels. Sinuses: No frontal/maxillary tenderness ENT/Mouth: EACs patent / TMs  nl. Nares clear without erythema, swelling, mucoid exudates. Oral hygiene is good. No erythema, swelling, or exudate. Tongue normal, non-obstructing. Tonsils not swollen or erythematous. Hearing normal.  Neck: Supple, thyroid not palpable. No bruits, nodes or JVD. Respiratory: Respiratory effort normal.  BS equal and clear bilateral without rales, rhonci, wheezing or stridor. Cardio: Heart sounds are normal with regular rate and rhythm and no murmurs, rubs or gallops. Peripheral pulses are normal and equal bilaterally without edema. No aortic or femoral bruits. Chest: symmetric with normal excursions and percussion.  Abdomen: Soft, with Nl bowel sounds. Nontender, no guarding, rebound, hernias, masses, or organomegaly.  Lymphatics: Non tender without lymphadenopathy.  Musculoskeletal: Full  ROM all peripheral extremities, joint stability, 5/5 strength, and normal gait. Skin: Warm and dry without rashes, lesions, cyanosis, clubbing or  ecchymosis.  Neuro: Cranial nerves intact, reflexes equal bilaterally. Normal muscle tone, no cerebellar symptoms. Sensation intact.  Pysch: Alert and oriented X 3 with normal affect, insight and judgment appropriate.   Assessment and Plan  1. Annual Preventative/Screening Exam   2. Labile hypertension  - EKG 12-Lead - Urinalysis, Routine w reflex microscopic - Microalbumin / creatinine urine ratio - CBC with Differential/Platelet - COMPLETE METABOLIC PANEL WITH GFR - Magnesium - TSH  3. Hyperlipidemia, mixed  - EKG 12-Lead - Lipid panel - TSH  4. Abnormal glucose  - EKG 12-Lead - Hemoglobin A1c - Insulin, random  5. Vitamin D deficiency  - VITAMIN D 25 Hydroxyl  6. Irritable bowel syndrome   7. Screening-pulmonary TB  - TB Skin Test  8. Screening for colorectal cancer  - POC Hemoccult Bld/Stl  9. FH: hypertension  - EKG 12-Lead  10. Screening for ischemic heart disease  - EKG 12-Lead  11. Former smoker  - EKG 12-Lead  12. Screening examination for pulmonary tuberculosis   13. Fatigue  - Iron,Total/Total Iron Binding Cap - PSA - Testosterone - CBC with Differential/Platelet - TSH  14. Medication management  - Urinalysis, Routine  w reflex microscopic - Microalbumin / creatinine urine ratio - CBC with Differential/Platelet - COMPLETE METABOLIC PANEL WITH GFR - Magnesium - Lipid panel - TSH - Hemoglobin A1c - Insulin, random - VITAMIN D 25 Hydroxyl        Patient was counseled in prudent diet, weight control to achieve/maintain BMI less than 25, BP monitoring, regular exercise and medications as discussed.  Discussed med effects and SE's. Routine screening labs and tests as requested with regular follow-up as recommended. Over 40 minutes of exam, counseling, chart review and high complex  critical decision making was performed   Dwayne Maw, MD

## 2019-08-10 ENCOUNTER — Ambulatory Visit (INDEPENDENT_AMBULATORY_CARE_PROVIDER_SITE_OTHER): Payer: BC Managed Care – PPO | Admitting: Internal Medicine

## 2019-08-10 ENCOUNTER — Other Ambulatory Visit: Payer: Self-pay

## 2019-08-10 VITALS — BP 116/72 | HR 88 | Temp 97.2°F | Resp 16 | Ht 68.5 in | Wt 166.0 lb

## 2019-08-10 DIAGNOSIS — Z0000 Encounter for general adult medical examination without abnormal findings: Secondary | ICD-10-CM | POA: Diagnosis not present

## 2019-08-10 DIAGNOSIS — R7309 Other abnormal glucose: Secondary | ICD-10-CM

## 2019-08-10 DIAGNOSIS — E559 Vitamin D deficiency, unspecified: Secondary | ICD-10-CM

## 2019-08-10 DIAGNOSIS — E782 Mixed hyperlipidemia: Secondary | ICD-10-CM

## 2019-08-10 DIAGNOSIS — Z111 Encounter for screening for respiratory tuberculosis: Secondary | ICD-10-CM | POA: Diagnosis not present

## 2019-08-10 DIAGNOSIS — Z1389 Encounter for screening for other disorder: Secondary | ICD-10-CM | POA: Diagnosis not present

## 2019-08-10 DIAGNOSIS — R0989 Other specified symptoms and signs involving the circulatory and respiratory systems: Secondary | ICD-10-CM

## 2019-08-10 DIAGNOSIS — Z1329 Encounter for screening for other suspected endocrine disorder: Secondary | ICD-10-CM

## 2019-08-10 DIAGNOSIS — K589 Irritable bowel syndrome without diarrhea: Secondary | ICD-10-CM

## 2019-08-10 DIAGNOSIS — R350 Frequency of micturition: Secondary | ICD-10-CM

## 2019-08-10 DIAGNOSIS — Z79899 Other long term (current) drug therapy: Secondary | ICD-10-CM

## 2019-08-10 DIAGNOSIS — R5383 Other fatigue: Secondary | ICD-10-CM

## 2019-08-10 DIAGNOSIS — Z87891 Personal history of nicotine dependence: Secondary | ICD-10-CM | POA: Diagnosis not present

## 2019-08-10 DIAGNOSIS — Z0001 Encounter for general adult medical examination with abnormal findings: Secondary | ICD-10-CM

## 2019-08-10 DIAGNOSIS — Z130 Encounter for screening for diseases of the blood and blood-forming organs and certain disorders involving the immune mechanism: Secondary | ICD-10-CM

## 2019-08-10 DIAGNOSIS — Z1211 Encounter for screening for malignant neoplasm of colon: Secondary | ICD-10-CM

## 2019-08-10 DIAGNOSIS — Z125 Encounter for screening for malignant neoplasm of prostate: Secondary | ICD-10-CM | POA: Diagnosis not present

## 2019-08-10 DIAGNOSIS — Z8249 Family history of ischemic heart disease and other diseases of the circulatory system: Secondary | ICD-10-CM | POA: Diagnosis not present

## 2019-08-10 DIAGNOSIS — I10 Essential (primary) hypertension: Secondary | ICD-10-CM

## 2019-08-10 DIAGNOSIS — Z13220 Encounter for screening for lipoid disorders: Secondary | ICD-10-CM | POA: Diagnosis not present

## 2019-08-10 DIAGNOSIS — Z131 Encounter for screening for diabetes mellitus: Secondary | ICD-10-CM | POA: Diagnosis not present

## 2019-08-10 DIAGNOSIS — N401 Enlarged prostate with lower urinary tract symptoms: Secondary | ICD-10-CM | POA: Diagnosis not present

## 2019-08-10 DIAGNOSIS — Z23 Encounter for immunization: Secondary | ICD-10-CM | POA: Diagnosis not present

## 2019-08-10 DIAGNOSIS — Z1212 Encounter for screening for malignant neoplasm of rectum: Secondary | ICD-10-CM

## 2019-08-10 DIAGNOSIS — Z136 Encounter for screening for cardiovascular disorders: Secondary | ICD-10-CM

## 2019-08-11 ENCOUNTER — Other Ambulatory Visit: Payer: Self-pay | Admitting: Internal Medicine

## 2019-08-11 DIAGNOSIS — E782 Mixed hyperlipidemia: Secondary | ICD-10-CM

## 2019-08-11 LAB — CBC WITH DIFFERENTIAL/PLATELET
Absolute Monocytes: 456 cells/uL (ref 200–950)
Basophils Absolute: 21 cells/uL (ref 0–200)
Basophils Relative: 0.4 %
Eosinophils Absolute: 58 cells/uL (ref 15–500)
Eosinophils Relative: 1.1 %
HCT: 44 % (ref 38.5–50.0)
Hemoglobin: 14.6 g/dL (ref 13.2–17.1)
Lymphs Abs: 1182 cells/uL (ref 850–3900)
MCH: 28.1 pg (ref 27.0–33.0)
MCHC: 33.2 g/dL (ref 32.0–36.0)
MCV: 84.6 fL (ref 80.0–100.0)
MPV: 12.9 fL — ABNORMAL HIGH (ref 7.5–12.5)
Monocytes Relative: 8.6 %
Neutro Abs: 3583 cells/uL (ref 1500–7800)
Neutrophils Relative %: 67.6 %
Platelets: 196 10*3/uL (ref 140–400)
RBC: 5.2 10*6/uL (ref 4.20–5.80)
RDW: 13.2 % (ref 11.0–15.0)
Total Lymphocyte: 22.3 %
WBC: 5.3 10*3/uL (ref 3.8–10.8)

## 2019-08-11 LAB — TSH: TSH: 1.14 mIU/L (ref 0.40–4.50)

## 2019-08-11 LAB — URINALYSIS, ROUTINE W REFLEX MICROSCOPIC
Bilirubin Urine: NEGATIVE
Glucose, UA: NEGATIVE
Hgb urine dipstick: NEGATIVE
Ketones, ur: NEGATIVE
Leukocytes,Ua: NEGATIVE
Nitrite: NEGATIVE
Protein, ur: NEGATIVE
Specific Gravity, Urine: 1.021 (ref 1.001–1.03)
pH: 5.5 (ref 5.0–8.0)

## 2019-08-11 LAB — COMPLETE METABOLIC PANEL WITH GFR
AG Ratio: 1.9 (calc) (ref 1.0–2.5)
ALT: 11 U/L (ref 9–46)
AST: 18 U/L (ref 10–40)
Albumin: 4.5 g/dL (ref 3.6–5.1)
Alkaline phosphatase (APISO): 47 U/L (ref 36–130)
BUN: 25 mg/dL (ref 7–25)
CO2: 27 mmol/L (ref 20–32)
Calcium: 10.2 mg/dL (ref 8.6–10.3)
Chloride: 103 mmol/L (ref 98–110)
Creat: 1.01 mg/dL (ref 0.60–1.35)
GFR, Est African American: 107 mL/min/{1.73_m2} (ref >=60)
GFR, Est Non African American: 93 mL/min/{1.73_m2} (ref >=60)
Globulin: 2.4 g/dL (calc) (ref 1.9–3.7)
Glucose, Bld: 84 mg/dL (ref 65–99)
Potassium: 5.1 mmol/L (ref 3.5–5.3)
Sodium: 139 mmol/L (ref 135–146)
Total Bilirubin: 1.5 mg/dL — ABNORMAL HIGH (ref 0.2–1.2)
Total Protein: 6.9 g/dL (ref 6.1–8.1)

## 2019-08-11 LAB — MICROALBUMIN / CREATININE URINE RATIO
Creatinine, Urine: 100 mg/dL (ref 20–320)
Microalb Creat Ratio: 7 mcg/mg creat (ref <30)
Microalb, Ur: 0.7 mg/dL

## 2019-08-11 LAB — LIPID PANEL
Cholesterol: 225 mg/dL — ABNORMAL HIGH (ref <200)
HDL: 60 mg/dL (ref >=40)
LDL Cholesterol (Calc): 148 mg/dL (calc) — ABNORMAL HIGH
Non-HDL Cholesterol (Calc): 165 mg/dL (calc) — ABNORMAL HIGH (ref <130)
Total CHOL/HDL Ratio: 3.8 (calc) (ref <5.0)
Triglycerides: 70 mg/dL (ref <150)

## 2019-08-11 LAB — VITAMIN D 25 HYDROXY (VIT D DEFICIENCY, FRACTURES): Vit D, 25-Hydroxy: 39 ng/mL (ref 30–100)

## 2019-08-11 LAB — IRON, TOTAL/TOTAL IRON BINDING CAP
%SAT: 21 % (calc) (ref 20–48)
Iron: 83 ug/dL (ref 50–180)
TIBC: 396 mcg/dL (calc) (ref 250–425)

## 2019-08-11 LAB — PSA: PSA: 1.1 ng/mL (ref <=4.0)

## 2019-08-11 LAB — HEMOGLOBIN A1C
Hgb A1c MFr Bld: 5.2 % of total Hgb (ref <5.7)
Mean Plasma Glucose: 103 (calc)
eAG (mmol/L): 5.7 (calc)

## 2019-08-11 LAB — INSULIN, RANDOM: Insulin: 15.3 u[IU]/mL

## 2019-08-11 LAB — TESTOSTERONE: Testosterone: 610 ng/dL (ref 250–827)

## 2019-08-11 LAB — MAGNESIUM: Magnesium: 1.9 mg/dL (ref 1.5–2.5)

## 2019-08-11 MED ORDER — ROSUVASTATIN CALCIUM 20 MG PO TABS: ORAL_TABLET | ORAL | 1 refills | Status: AC

## 2019-08-13 ENCOUNTER — Encounter: Payer: Self-pay | Admitting: Internal Medicine

## 2019-11-09 NOTE — Progress Notes (Deleted)
FOLLOW UP  Assessment and Plan:   Hypertension Fairly controlled off of medication at this time  Monitor blood pressure at home; patient to call if consistently greater than 130/80 Continue DASH diet.   Reminder to go to the ER if any CP, SOB, nausea, dizziness, severe HA, changes vision/speech, left arm numbness and tingling and jaw pain.  Cholesterol Currently above goal; newly prescribed rosuvastatin *** Continue low cholesterol diet and exercise.  Check lipid panel.   Abnormal glucose Recent A1Cs at goal Discussed diet/exercise, weight management  Defer A1C; check CMP for serum glucose  BMI 24 *** Continue to recommend diet heavy in fruits and veggies and low in animal meats, cheeses, and dairy products, appropriate calorie intake Discuss exercise recommendations routinely Continue to monitor weight at each visit  Vitamin D Def Below goal at last visit; *** continue supplementation to maintain goal of 60-100 Defer Vit D level  Continue diet and meds as discussed. Further disposition pending results of labs. Discussed med's effects and SE's.   Over 30 minutes of exam, counseling, chart review, and critical decision making was performed.   Future Appointments  Date Time Provider [place]  11/20/2019  2:30 PM Liane Comber, NP [facility] None  02/20/2020  2:30 PM Unk Pinto, MD [facility] None  09/12/2020  3:30 PM Unk Pinto, MD [facility] None    ----------------------------------------------------------------------------------------------------------------------  HPI 39 y.o. male  presents for 3 month follow up on hypertension, cholesterol, weight and vitamin D deficiency.   He has hx of IBS, levsin ***  BMI is There is no height or weight on file to calculate BMI., he {HAS HAS QQP:61950} been working on diet and exercise. Wt Readings from Last 3 Encounters:  08/10/19 166 lb (75.3 kg)  07/07/18 168 lb 3.2 oz (76.3 kg)  03/28/18 168 lb 9.6 oz  (76.5 kg)   His blood pressure {HAS HAS NOT:18834} been controlled at home, today their BP is    He {DOES_DOES DTO:67124} workout. He denies chest pain, shortness of breath, dizziness.   He is on cholesterol medication Rosuvastatin 40 mg daily *** new *** and denies myalgias. His cholesterol is not at goal. The cholesterol last visit was:   Lab Results  Component Value Date   CHOL 225 (H) 08/10/2019   HDL 60 08/10/2019   LDLCALC 148 (H) 08/10/2019   TRIG 70 08/10/2019   CHOLHDL 3.8 08/10/2019    He {Has/has not:18111} been working on diet and exercise for glucose management, and denies {Symptoms; diabetes w/o none:19199}. Last A1C in the office was:  Lab Results  Component Value Date   HGBA1C 5.2 08/10/2019   Patient is on Vitamin D supplement.   Lab Results  Component Value Date   VD25OH 39 08/10/2019        Current Medications:  Current Outpatient Medications on File Prior to Visit  Medication Sig  . acetaminophen (TYLENOL) 500 MG tablet Take 1 tablet (500 mg total) by mouth every 6 (six) hours as needed.  . Cholecalciferol (VITAMIN D PO) Take 4,000 Units by mouth daily.   . rosuvastatin (CRESTOR) 20 MG tablet Take  1 tablet daily for Cholesterol   No current facility-administered medications on file prior to visit.     Allergies: No Known Allergies   Medical History:  Past Medical History:  Diagnosis Date  . Hx of migraines   . Irritable bowel syndrome   . Labile hypertension   . Mixed hyperlipidemia    Family history- Reviewed and unchanged Social history-  Reviewed and unchanged   Review of Systems:  ROS    Physical Exam: There were no vitals taken for this visit. Wt Readings from Last 3 Encounters:  08/10/19 166 lb (75.3 kg)  07/07/18 168 lb 3.2 oz (76.3 kg)  03/28/18 168 lb 9.6 oz (76.5 kg)   General Appearance: Well nourished, in no apparent distress. Eyes: PERRLA, EOMs, conjunctiva no swelling or erythema Sinuses: No Frontal/maxillary  tenderness ENT/Mouth: Ext aud canals clear, TMs without erythema, bulging. No erythema, swelling, or exudate on post pharynx.  Tonsils not swollen or erythematous. Hearing normal.  Neck: Supple, thyroid normal.  Respiratory: Respiratory effort normal, BS equal bilaterally without rales, rhonchi, wheezing or stridor.  Cardio: RRR with no MRGs. Brisk peripheral pulses without edema.  Abdomen: Soft, + BS.  Non tender, no guarding, rebound, hernias, masses. Lymphatics: Non tender without lymphadenopathy.  Musculoskeletal: Full ROM, 5/5 strength, Normal gait Skin: Warm, dry without rashes, lesions, ecchymosis.  Neuro: Cranial nerves intact. No cerebellar symptoms.  Psych: Awake and oriented X 3, normal affect, Insight and Judgment appropriate.    Dan Maker, NP 1:39 PM [facility]

## 2019-11-20 ENCOUNTER — Ambulatory Visit: Payer: BC Managed Care – PPO | Admitting: Adult Health

## 2020-02-20 ENCOUNTER — Ambulatory Visit: Payer: BC Managed Care – PPO | Admitting: Internal Medicine

## 2020-03-06 ENCOUNTER — Encounter: Payer: Self-pay | Admitting: Internal Medicine

## 2020-03-06 NOTE — Progress Notes (Signed)
  C  H  A R  T      C  L  O  S E  D   A T   P  A T  I  E N  T     R E Q U  E  S  T   D  U  E     T  O  N  O  N  -  P A  Y  M  E N T   O  U  T    S  T  A  N  D  I  N  G       B  A L  A  N  C  E  S

## 2020-03-07 ENCOUNTER — Encounter: Payer: Self-pay | Admitting: Internal Medicine

## 2020-03-07 ENCOUNTER — Ambulatory Visit: Payer: BC Managed Care – PPO | Admitting: Internal Medicine

## 2020-03-07 ENCOUNTER — Other Ambulatory Visit: Payer: Self-pay

## 2020-09-12 ENCOUNTER — Encounter: Payer: BC Managed Care – PPO | Admitting: Internal Medicine

## 2020-09-12 ENCOUNTER — Encounter: Payer: BC Managed Care – PPO | Admitting: Adult Health
